# Patient Record
Sex: Female | Born: 1985 | Race: Black or African American | Hispanic: No | Marital: Single | Smoking: Current every day smoker
Health system: Southern US, Community
[De-identification: ages and names within clinical notes are randomized; demographics above are authoritative.]

## PROBLEM LIST (undated history)

## (undated) DIAGNOSIS — Z789 Other specified health status: Secondary | ICD-10-CM

## (undated) DIAGNOSIS — R112 Nausea with vomiting, unspecified: Secondary | ICD-10-CM

## (undated) DIAGNOSIS — Z9889 Other specified postprocedural states: Secondary | ICD-10-CM

## (undated) HISTORY — PX: WISDOM TOOTH EXTRACTION: SHX21

## (undated) HISTORY — PX: CERVICAL CERCLAGE: SHX1329

---
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
## 2007-07-14 ENCOUNTER — Ambulatory Visit (HOSPITAL_COMMUNITY): Admission: RE | Admit: 2007-07-14 | Discharge: 2007-07-15 | Payer: Self-pay | Admitting: Obstetrics

## 2007-08-05 ENCOUNTER — Inpatient Hospital Stay (HOSPITAL_COMMUNITY): Admission: AD | Admit: 2007-08-05 | Discharge: 2007-08-16 | Payer: Self-pay | Admitting: Obstetrics

## 2007-08-13 ENCOUNTER — Encounter (INDEPENDENT_AMBULATORY_CARE_PROVIDER_SITE_OTHER): Payer: Self-pay | Admitting: Obstetrics

## 2010-04-08 ENCOUNTER — Inpatient Hospital Stay (HOSPITAL_COMMUNITY)
Admission: AD | Admit: 2010-04-08 | Discharge: 2010-04-09 | Disposition: A | Discharge status: Home / Self Care | Payer: Self-pay | Source: Ambulatory Visit | Attending: Obstetrics | Admitting: Obstetrics

## 2010-04-08 DIAGNOSIS — A499 Bacterial infection, unspecified: Secondary | ICD-10-CM

## 2010-04-08 DIAGNOSIS — N760 Acute vaginitis: Secondary | ICD-10-CM | POA: Insufficient documentation

## 2010-04-08 DIAGNOSIS — N949 Unspecified condition associated with female genital organs and menstrual cycle: Secondary | ICD-10-CM | POA: Insufficient documentation

## 2010-04-08 DIAGNOSIS — B9689 Other specified bacterial agents as the cause of diseases classified elsewhere: Secondary | ICD-10-CM | POA: Insufficient documentation

## 2010-04-08 LAB — WET PREP, GENITAL
Trich, Wet Prep: NONE SEEN
Yeast Wet Prep HPF POC: NONE SEEN

## 2010-04-08 LAB — POCT PREGNANCY, URINE: Preg Test, Ur: NEGATIVE

## 2010-05-28 NOTE — Op Note (Signed)
NAME:  Ariel Pittman, Ariel Pittman             ACCOUNT NO.:  [id number]   MEDICAL RECORD NO.:  [id number]          PATIENT TYPE:  INP   LOCATION:  9316                          FACILITY:  [facility]   PHYSICIAN:  Kathreen Cosier, M.D.DATE OF BIRTH:  Dec 20, 1985   DATE OF PROCEDURE:  08/13/2007  DATE OF DISCHARGE:  08/16/2007                               OPERATIVE REPORT   PREOPERATIVE DIAGNOSES:  1. Incompetent cervix.  2. Breech presentation.  3. Ruptured membranes.  4. Failed cerclage at 24 weeks and classical.   POSTOPERATIVE DIAGNOSES:  1. Incompetent cervix.  2. Breech presentation.  3. Ruptured membranes.  4. Failed cerclage at 24 weeks and classical.   ANESTHESIA:  Spinal.   PROCEDURE:  The patient was placed on the operating table in the supine  position after the spinal was administered.  Transverse suprapubic  incision made was carried to the rectus fascia.  Fascia cleaned and  incised to length of the incision.  Recti muscles retracted laterally.  Peritoneum incised longitudinally.  There was no lower segment though a  transverse incision was made on the uterus.  The patient was delivered  of a 1 pound 5-ounce female, Apgar 2, 5, and 6 from breech extraction.  The team was in attendance.  The placenta was anterior and removed  manually and sent to pathology.  The pH was 7.38.  Uterine cavity  cleaned with dry laps.  The uterine incision closed in one layer with  continuous suture of #1 chromic.  Hemostasis was satisfactory.  Bladder  flap reattached with 2-0 chromic.  Abdomen closed in layers; peritoneum,  continuous suture with 0 chromic; fascia, continuous suture with Dexon.  Skin closed with subcuticular suture of 4-0 Monocryl.  The patient  tolerated the procedure well and was taken to recovery room in good  condition.           ______________________________  Kathreen Cosier, M.D.     BAM/MEDQ  D:  09/22/2007  T:  09/22/2007  Job:  [id number]

## 2010-05-28 NOTE — Op Note (Signed)
NAME:  Ariel Pittman, Ariel Pittman             ACCOUNT NO.:  [id number]   MEDICAL RECORD NO.:  [id number]          PATIENT TYPE:  AMB   LOCATION:  SDC                           [facility]   PHYSICIAN:  Kathreen Cosier, M.D.DATE OF BIRTH:  05-20-1985   DATE OF PROCEDURE:  07/14/2007  DATE OF DISCHARGE:                               OPERATIVE REPORT   PREOPERATIVE DIAGNOSIS:  Incompetent cervix.   POSTOPERATIVE DIAGNOSIS:  Incompetent cervix.   PROCEDURE:  Placement of cervical cerclage.   PROCEDURE IN DETAIL:  After the spinal had administered and the patient  in lithotomy position, perineum and vagina prepped and draped.  Bladder  emptied with straight catheter.  Examination revealed cervix to be open.  A fingertip and very short weighted speculum placed in the vagina.  The  #5 Mersilene band was obtained and the needle was removed and replaced  by a small cutting edge needles.  The cerclage was placed starting at 12  o'clock in the usual manner and then tied at 12 o'clock.  Cervix was now  noted to be closed.  The patient tolerated the procedure well and taken  to recovery room in good condition.           ______________________________  Kathreen Cosier, M.D.     BAM/MEDQ  D:  07/14/2007  T:  07/15/2007  Job:  [id number]

## 2010-05-31 ENCOUNTER — Emergency Department (HOSPITAL_COMMUNITY)
Admission: EM | Admit: 2010-05-31 | Discharge: 2010-05-31 | Disposition: A | Discharge status: Home / Self Care | Payer: Self-pay | Attending: Emergency Medicine | Admitting: Emergency Medicine

## 2010-05-31 DIAGNOSIS — H9209 Otalgia, unspecified ear: Secondary | ICD-10-CM | POA: Insufficient documentation

## 2010-05-31 DIAGNOSIS — J029 Acute pharyngitis, unspecified: Secondary | ICD-10-CM | POA: Insufficient documentation

## 2010-05-31 LAB — URINALYSIS, ROUTINE W REFLEX MICROSCOPIC
Bilirubin Urine: NEGATIVE
Glucose, UA: NEGATIVE mg/dL
Hgb urine dipstick: NEGATIVE
Specific Gravity, Urine: 1.019 (ref 1.005–1.030)
Urobilinogen, UA: 0.2 mg/dL (ref 0.0–1.0)
pH: 6.5 (ref 5.0–8.0)

## 2010-05-31 LAB — RAPID STREP SCREEN (MED CTR MEBANE ONLY): Streptococcus, Group A Screen (Direct): NEGATIVE

## 2010-05-31 NOTE — H&P (Signed)
   NAME:  Ariel Pittman, Ariel Pittman                       ACCOUNT NO.:  [id number]   MEDICAL RECORD NO.:  [id number]                   PATIENT TYPE:  INP   LOCATION:  9144                                 [facility]   PHYSICIAN:  Kathreen Cosier, M.D.           DATE OF BIRTH:  23-Apr-1985   DATE OF ADMISSION:  09/12/2002  DATE OF DISCHARGE:                                HISTORY & PHYSICAL   HISTORY OF PRESENT ILLNESS:  The patient is a 25 year old primigravida, [facility]  November 07, 2002, at 32 weeks, who was admitted with premature contractions,  given terbutaline subcu with no effect and she was started on magnesium  sulfate.  She was given 4 g loading, 2 g/hr and betamethasone 12.5 IM x2  doses.  On September 12, 2002, fetal fibronectin was done which was positive.  Her cervix was 1-cm, 70%.  She had a comfortable night on the night of  September 12, 2002 and the morning of September 13, 2002, she was having 4-5  contractions an hour, mild.  She was not feeling them.  She was on magnesium  sulfate 2 g/hr.   On the night of September 13, 2002, the patient ruptured her membranes and the  nurse examined the patient and said she was 5-cm, pushing and she was  uncertain of the presentation.  An ultrasound was done which confirmed  double footling breech.  The patient was in labor, so, it was decided she  would deliver by C-section for premature rupture of membranes at 32-weeks in  active labor.   PHYSICAL EXAMINATION:  GENERAL:  This is a well-developed female, in labor.  HEENT:  Negative.  LUNGS:  Clear.  HEART:  Regular rhythm.  No murmurs, no gallops.  ABDOMEN:  Is 30-week size.  Fetal heart rate 140.  PELVIC:  As described above.  EXTREMITIES:  Negative.                                               Kathreen Cosier, M.D.    BAM/MEDQ  D:  09/13/2002  T:  09/14/2002  Job:  [id number]

## 2010-05-31 NOTE — Discharge Summary (Signed)
   NAME:  Ariel Pittman, Ariel Pittman                       ACCOUNT NO.:  [id number]   MEDICAL RECORD NO.:  [id number]                   PATIENT TYPE:  INP   LOCATION:  9146                                 [facility]   PHYSICIAN:  Kathreen Cosier, M.D.           DATE OF BIRTH:  Dec 29, 1985   DATE OF ADMISSION:  09/18/2002  DATE OF DISCHARGE:  09/21/2002                                 DISCHARGE SUMMARY   HISTORY OF PRESENT ILLNESS:  The patient is a 25 year old gravida 1, para 1  who had a low transverse cesarean section on September 13, 2002 because of  breech presentation and ruptured membranes.  She was admitted with a  temperature of 102.3 on September 18, 2002.  She was started on antibiotics  and on September 5 and September 6 she remained afebrile.  On September 7  her incision was noted to be tender and puffy and using Q-tips the incision  was opened in three places, draining a large amount of serosanguinous  material.  The incision was cleaned with hydrogen peroxide and packed with  iodoform gauze.  This was done on September 7.  The patient was discharged  on September 8.  She will be followed by [facility] and have her  incision cleaned daily until healing.   DISCHARGE DIAGNOSES:  Status post readmission for a wound infection post  primary low transverse cesarean section.                                               Kathreen Cosier, M.D.    BAM/MEDQ  D:  09/21/2002  T:  09/21/2002  Job:  [id number]

## 2010-05-31 NOTE — Discharge Summary (Signed)
   NAME:  Ariel Pittman, Ariel Pittman                       ACCOUNT NO.:  [id number]   MEDICAL RECORD NO.:  [id number]                   PATIENT TYPE:  INP   LOCATION:  9144                                 [facility]   PHYSICIAN:  Kathreen Cosier, M.D.           DATE OF BIRTH:  02-Feb-1985   DATE OF ADMISSION:  09/12/2002  DATE OF DISCHARGE:                                 DISCHARGE SUMMARY   The patient is a 25 year old primigravida, [facility] November 07, 2002.  She is 32-  weeks pregnant.  She was admitted with premature contractions, given  terbutaline subcu with no effect and was brought in for magnesium sulfate.  On admission she was 1-cm and long.  She got 4 gram loading of mag, 2 grams  per hour and betamethasone 12.5 IM x 2 doses.  The fetal fibronectin was  positive.  On the night of September 13, 2002 her membranes ruptured  spontaneously and she was 5-cm and she was a breech presentation.  She  underwent a primary low-transverse cesarean section and delivered a 3 pound  1 ounce female.  She had a T incision on her uterus because the head was  wrapped.  There was no well developed lower segment.  The cord pH was 7.39  and the weight of the baby correction was 3 pounds 4 ounces.   Postoperatively, her hemoglobin was 8.3.  She was asymptomatic.  Her GC  culture also came back positive and she received 1 gram of Rocephin IM prior  to discharge.  She was discharged on the third postoperative day,  ambulatory, on a regular diet, on Tylox for pain and ferrous sulfate for  anemia.   DISCHARGE DIAGNOSES:  Status post primary low-transverse cesarean section  with a T incision on the uterus.   Patient advised not to labor in the future.  She had premature ruptured  membranes at 58.  Positive GC culture treated with Rocephin.                                               Kathreen Cosier, M.D.    BAM/MEDQ  D:  09/16/2002  T:  09/17/2002  Job:  [id number]

## 2010-05-31 NOTE — Op Note (Signed)
   NAME:  Ariel Pittman, Ariel Pittman                       ACCOUNT NO.:  [id number]   MEDICAL RECORD NO.:  [id number]                   PATIENT TYPE:  INP   LOCATION:  9144                                 [facility]   PHYSICIAN:  Kathreen Cosier, M.D.           DATE OF BIRTH:  Apr 11, 1985   DATE OF PROCEDURE:  09/13/2002  DATE OF DISCHARGE:                                 OPERATIVE REPORT   PREOPERATIVE DIAGNOSIS:  Premature rupture of membranes at 32 weeks in  active labor with breech presentation.   SURGEON:  Kathreen Cosier, M.D.   ANESTHESIA:  Spinal.   PROCEDURE:  The patient was placed on operating table in supine position.  Abdomen prepped and draped.  Catheterized the bladder.  After the spinal was  administered, bladder emptied to Foley catheter.  Transverse suprapubic  incision made and carried down to the rectus fascia.  The fascia was  cleaned.  Through a cephalad incision, the recti muscles were retracted  laterally.  The peritoneum was entered.  The lower segmental pole was  developed.  Transverse lower uterine incision made in the visceral  peritoneum above the bladder and the bladder was mobilized inferiorly.  Transverse lower uterine incision was made and double footling breech  presentation.  However, difficulty was experienced getting the head  delivered, so a T incision was made in the uterus and delivery thereafter  was without problems.  APGAR score was 2 and 8.  The baby weighed 3 lb, 4  oz, female, pH 7.39.  placenta fundal removed manually and sent to  pathology.  Uterine cavity was dried with laps.  Cord pH was 7.39.  The  classical portion of the incision was closed in two layers with continuous  suture of #1 chromic.  Hemostasis was satisfactory.  The transverse lower  uterine incision was closed in 1 layer with continuous suture of #1 chromic.  Bladder flap was reattached with 2-0 chromic.  Uterus was contracted.  Tubes  and ovaries normal.  Abdomen  closed.  The peritoneum closed continuous with  3-0 chromic.  Fascia continuous with 3-0 Dexon.  Skin closed with  subcuticular stitch of 3-0 plain.   BLOOD LOSS:  400 cubic centimeter.   The patient tolerated the procedure well and was transferred to Recovery  Room in good condition.                                               Kathreen Cosier, M.D.    BAM/MEDQ  D:  09/13/2002  T:  09/14/2002  Job:  [id number]

## 2010-05-31 NOTE — Discharge Summary (Signed)
NAME:  Ariel Pittman, Ariel Pittman             ACCOUNT NO.:  [id number]   MEDICAL RECORD NO.:  [id number]          PATIENT TYPE:  INP   LOCATION:  9316                          [facility]   PHYSICIAN:  Kathreen Cosier, M.D.DATE OF BIRTH:  Nov 04, 1985   DATE OF ADMISSION:  08/05/2007  DATE OF DISCHARGE:  08/16/2007                               DISCHARGE SUMMARY   The patient is a 25 year old gravida 4, para 0-1-2-1, [facility] November 25, 2007, the patient had a history of incompetent cervix and also had a  cerclage placed.  She was seen in the office and her membranes were  bulging.  She was [redacted] weeks pregnant and admitted to the hospital.  She  got betamethasone and her membranes ruptured on admission.  By August 09, 2007, she was 24 weeks and 4 days.  Ultrasound showed a breech  presentation.  On August 11, 2007, she started contracting and the  cerclage was removed.  She was started on magnesium 4 g, loading 2 g now  and she stopped contracting.  On August 13, 2007, she started laboring and  ultrasound confirmed breech.  She was taken to the operating room and  had a C-section.  She had a female, Apgar 2, 5, 6, weighing 1 pound 5  ounces, pH was 7.38.  The patient had a classical cesarean section.  She  did well postop.  On admission, her hemoglobin 9.5, postop 8.5,  platelets 311, 260.  Sodium 138, potassium 3.8, chloride 105, CO2 27.  She was discharged on the third postop day on Tylox for pain, Ambien 10  mg p.o. nightly, on ferrous sulfate for anemia.   DISCHARGE DIAGNOSES:  Incompetent cervix, feels cerclage, and classical  cesarean section for breech at 24 weeks.           ______________________________  Kathreen Cosier, M.D.     BAM/MEDQ  D:  09/22/2007  T:  09/22/2007  Job:  [id number]

## 2010-05-31 NOTE — Discharge Summary (Signed)
NAME:  Ariel Pittman, FORNEY             ACCOUNT NO.:  [id number]   MEDICAL RECORD NO.:  [id number]          PATIENT TYPE:  INP   LOCATION:  9316                          [facility]   PHYSICIAN:  Kathreen Cosier, M.D.DATE OF BIRTH:  Dec 03, 1985   DATE OF ADMISSION:  08/05/2007  DATE OF DISCHARGE:  08/16/2007                               DISCHARGE SUMMARY   The patient is a 25 year old gravida 4, para 0-1-2-1, [facility] November 25, 2007.  She had a cerclage placed because of incompetent cervix.  On  examination on August 05, 2007 in my office showed bulging membranes and  she was admitted to the hospital at 23 weeks.  She was placed on  magnesium sulfate.  She received betamethasone and ultrasound showed a  breech.  She was placed on ampicillin and gentamicin.  On 08/11/2007,  she started to contract and the cerclage was removed and magnesium  sulfate was restarted, 2 g an hour.  Ultrasound showed breech  presentation and after the magnesium was started, she stopped  contracting from August 11, 2007 to August 13, 2007 and on August 13, 2007  started contracting again.  She was taken to the operating room and had  repeat low transverse cesarean section.  She had a 1 pound 5 ounce  female Apgars of 2, 5, and 6.  Postop, she did well.   Her hemoglobin was 8.5 and white count 16.  Sodium 138, potassium 3.8,  chloride 105, RPR negative, HIV negative.  She was discharged on the  third postoperative day on Tylox for pain, Ambien 10 mg at bedtime, and  ferrous sulfate for anemia.   DISCHARGE DIAGNOSES:  Status post premature rupture of membranes,  incompetent cervix, premature labor at 25 weeks with subsequent repeat  low transverse cesarean section.           ______________________________  Kathreen Cosier, M.D.     BAM/MEDQ  D:  09/01/2007  T:  09/01/2007  Job:  [id number]

## 2010-10-10 ENCOUNTER — Encounter (HOSPITAL_COMMUNITY): Payer: Self-pay | Admitting: *Deleted

## 2010-10-10 ENCOUNTER — Inpatient Hospital Stay (HOSPITAL_COMMUNITY)
Admission: AD | Admit: 2010-10-10 | Discharge: 2010-10-10 | Disposition: A | Discharge status: Home / Self Care | Payer: Self-pay | Source: Ambulatory Visit | Attending: Obstetrics | Admitting: Obstetrics

## 2010-10-10 DIAGNOSIS — N760 Acute vaginitis: Secondary | ICD-10-CM | POA: Insufficient documentation

## 2010-10-10 DIAGNOSIS — B9689 Other specified bacterial agents as the cause of diseases classified elsewhere: Secondary | ICD-10-CM | POA: Insufficient documentation

## 2010-10-10 DIAGNOSIS — A499 Bacterial infection, unspecified: Secondary | ICD-10-CM

## 2010-10-10 HISTORY — DX: Other specified health status: Z78.9

## 2010-10-10 LAB — WET PREP, GENITAL
Trich, Wet Prep: NONE SEEN
Yeast Wet Prep HPF POC: NONE SEEN

## 2010-10-10 LAB — URINALYSIS, ROUTINE W REFLEX MICROSCOPIC
Bilirubin Urine: NEGATIVE
Glucose, UA: NEGATIVE mg/dL
Leukocytes, UA: NEGATIVE
Nitrite: NEGATIVE
Specific Gravity, Urine: >1.03 — ABNORMAL HIGH (ref 1.005–1.030)
pH: 6 (ref 5.0–8.0)

## 2010-10-10 LAB — CBC
HCT: 27.3 — ABNORMAL LOW
Hemoglobin: 9.4 — ABNORMAL LOW
MCV: 94.7
Platelets: 253
RBC: 2.88 — ABNORMAL LOW
WBC: 8.3

## 2010-10-10 MED ORDER — METRONIDAZOLE 500 MG PO TABS: 500.0000 mg | ORAL_TABLET | Freq: Two times a day (BID) | ORAL | Status: AC

## 2010-10-10 NOTE — Progress Notes (Signed)
Pt reports she thinks she has a bladder infection, states her urine has an odor.denies pain or burning with urination.

## 2010-10-10 NOTE — Progress Notes (Signed)
Pt reports vaginal discharge with a "fishy" smell.

## 2010-10-10 NOTE — ED Provider Notes (Signed)
History     No chief complaint on file.  HPI C/O odor to urine, fishy smelling vaginal discharge. No pain or vaginal bleeding.   OB History    Grav Para Term Preterm Abortions TAB SAB Ect Mult Living   4 2  2 2 1 1   2       Past Medical History  Diagnosis Date  . No pertinent past medical history     Past Surgical History  Procedure Date  . Cesarean section   . Cervical cerclage     No family history on file.  History  Substance Use Topics  . Smoking status: Current Everyday Smoker -- 0.5 packs/day  . Smokeless tobacco: Not on file  . Alcohol Use: 1.0 oz/week    2 drink(s) per week    Allergies:  Allergies  Allergen Reactions  . Tomato Hives    No prescriptions prior to admission    Review of Systems  Constitutional: Negative.   Respiratory: Negative.   Cardiovascular: Negative.   Gastrointestinal: Negative for nausea, vomiting, abdominal pain, diarrhea and constipation.  Genitourinary: Negative for dysuria, urgency, frequency, hematuria and flank pain.       Negative for vaginal bleeding, cramping/contractions, + discharge   Musculoskeletal: Negative.   Neurological: Negative.   Psychiatric/Behavioral: Negative.    Physical Exam   Blood pressure 120/72, pulse 84, temperature 98.6 F (37 C), resp. rate 18, height 5\' 6"  (1.676 m), weight 68.493 kg (151 lb), last menstrual period 09/14/2010.  Physical Exam  Nursing note and vitals reviewed. Constitutional: She is oriented to person, place, and time. She appears well-developed and well-nourished. No distress.  HENT:  Head: Normocephalic and atraumatic.  Cardiovascular: Normal rate.   Respiratory: Effort normal. No respiratory distress.  GI: Soft. She exhibits no distension and no mass. There is no tenderness. There is no rebound and no guarding.  Genitourinary: There is no rash or lesion on the right labia. There is no rash or lesion on the left labia. Uterus is not deviated, not enlarged, not fixed  and not tender. Cervix exhibits no motion tenderness, no discharge and no friability. Right adnexum displays no mass, no tenderness and no fullness. Left adnexum displays no mass, no tenderness and no fullness. No erythema, tenderness or bleeding around the vagina. Vaginal discharge (white, foamy, malodorous) found.  Musculoskeletal: Normal range of motion.  Neurological: She is alert and oriented to person, place, and time.  Skin: Skin is warm and dry.  Psychiatric: She has a normal mood and affect.    MAU Course  Procedures  Results for orders placed during the hospital encounter of 10/10/10 (from the past 24 hour(s))  URINALYSIS, ROUTINE W REFLEX MICROSCOPIC     Status: Abnormal   Collection Time   10/10/10 10:35 PM      Component Value Range   Color, Urine YELLOW  YELLOW    Appearance CLEAR  CLEAR    Specific Gravity, Urine >1.030 (*) 1.005 - 1.030    pH 6.0  5.0 - 8.0    Glucose, UA NEGATIVE  NEGATIVE (mg/dL)   Hgb urine dipstick NEGATIVE  NEGATIVE    Bilirubin Urine NEGATIVE  NEGATIVE    Ketones, ur NEGATIVE  NEGATIVE (mg/dL)   Protein, ur NEGATIVE  NEGATIVE (mg/dL)   Urobilinogen, UA 0.2  0.0 - 1.0 (mg/dL)   Nitrite NEGATIVE  NEGATIVE    Leukocytes, UA NEGATIVE  NEGATIVE   POCT PREGNANCY, URINE     Status: Normal   Collection Time  10/10/10 10:55 PM      Component Value Range   Preg Test, Ur NEGATIVE    WET PREP, GENITAL     Status: Abnormal   Collection Time   10/10/10 11:30 PM      Component Value Range   Yeast, Wet Prep NONE SEEN  NONE SEEN    Trich, Wet Prep NONE SEEN  NONE SEEN    Clue Cells, Wet Prep MODERATE (*) NONE SEEN    WBC, Wet Prep HPF POC MODERATE (*) NONE SEEN      Assessment and Plan  Bacterial vaginosis - flagyl rx given No douching, recommended probiotic supplement, gentle cleansing  Oiva Dibari 10/11/2010, 1:56 AM

## 2010-10-11 LAB — CBC
HCT: 27.7 — ABNORMAL LOW
HCT: 28 — ABNORMAL LOW
Hemoglobin: 9.5 — ABNORMAL LOW
MCHC: 34
MCV: 95.8
MCV: 97.5
Platelets: 311
RBC: 2.89 — ABNORMAL LOW
RBC: 2.59 — ABNORMAL LOW
RDW: 12.2
WBC: 18.1 — ABNORMAL HIGH
WBC: 16 — ABNORMAL HIGH

## 2010-10-11 LAB — COMPREHENSIVE METABOLIC PANEL
BUN: 7
Calcium: 9.1
Glucose, Bld: 79
Total Protein: 6.8

## 2010-10-11 LAB — CREATININE, SERUM: GFR calc Af Amer: >60

## 2010-10-11 LAB — URINALYSIS, DIPSTICK ONLY
Bilirubin Urine: NEGATIVE
Glucose, UA: NEGATIVE
Hgb urine dipstick: NEGATIVE
Ketones, ur: NEGATIVE
Leukocytes, UA: NEGATIVE
pH: 6.5

## 2010-10-11 LAB — DIFFERENTIAL
Lymphocytes Relative: 12
Lymphs Abs: 1.4
Monocytes Relative: 5
Neutro Abs: 9.2 — ABNORMAL HIGH
Neutrophils Relative %: 80 — ABNORMAL HIGH

## 2010-10-11 NOTE — ED Provider Notes (Signed)
Agree with above note.  Ariel Pittman 10/11/2010 7:55 AM

## 2010-10-12 LAB — GC/CHLAMYDIA PROBE AMP, GENITAL: Chlamydia, DNA Probe: NEGATIVE

## 2010-10-22 LAB — LUPUS ANTICOAGULANT PANEL

## 2010-10-22 LAB — CBC
HCT: 30.2 — ABNORMAL LOW
Hemoglobin: 10.4 — ABNORMAL LOW
Platelets: 284
WBC: 8.2

## 2010-10-22 LAB — CARDIOLIPIN ANTIBODIES, IGM+IGG: Anticardiolipin IgM: 9 — ABNORMAL LOW (ref <10)

## 2010-10-25 LAB — URINALYSIS, ROUTINE W REFLEX MICROSCOPIC
Glucose, UA: NEGATIVE
Hgb urine dipstick: NEGATIVE
Protein, ur: NEGATIVE
Specific Gravity, Urine: 1.01

## 2010-10-25 LAB — POCT PREGNANCY, URINE: Preg Test, Ur: POSITIVE

## 2010-10-25 LAB — URINE MICROSCOPIC-ADD ON

## 2010-10-25 LAB — GC/CHLAMYDIA PROBE AMP, GENITAL: Chlamydia, DNA Probe: POSITIVE — AB

## 2010-10-28 LAB — POCT PREGNANCY, URINE
Operator id: 239701
Preg Test, Ur: POSITIVE

## 2010-10-28 LAB — POCT URINALYSIS DIP (DEVICE)
Glucose, UA: NEGATIVE
Nitrite: NEGATIVE
Operator id: 239701
Urobilinogen, UA: 0.2

## 2010-12-28 ENCOUNTER — Emergency Department (HOSPITAL_COMMUNITY)
Admission: EM | Admit: 2010-12-28 | Discharge: 2010-12-28 | Disposition: A | Discharge status: Home / Self Care | Payer: No Typology Code available for payment source | Attending: Emergency Medicine | Admitting: Emergency Medicine

## 2010-12-28 ENCOUNTER — Encounter (HOSPITAL_COMMUNITY): Payer: Self-pay | Admitting: *Deleted

## 2010-12-28 DIAGNOSIS — M549 Dorsalgia, unspecified: Secondary | ICD-10-CM | POA: Insufficient documentation

## 2010-12-28 DIAGNOSIS — T1490XA Injury, unspecified, initial encounter: Secondary | ICD-10-CM | POA: Insufficient documentation

## 2010-12-28 DIAGNOSIS — Y92410 Unspecified street and highway as the place of occurrence of the external cause: Secondary | ICD-10-CM | POA: Insufficient documentation

## 2010-12-28 MED ORDER — IBUPROFEN 800 MG PO TABS: 800.0000 mg | ORAL_TABLET | Freq: Three times a day (TID) | ORAL | Status: AC

## 2010-12-28 MED ORDER — DIAZEPAM 5 MG PO TABS: 5.0000 mg | ORAL_TABLET | Freq: Two times a day (BID) | ORAL | Status: AC

## 2010-12-28 MED ORDER — IBUPROFEN 800 MG PO TABS
800.0000 mg | ORAL_TABLET | Freq: Once | ORAL | Status: AC
  Administered 2010-12-28: 800 mg via ORAL
  Filled 2010-12-28: qty 1
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
## 2010-12-29 NOTE — ED Provider Notes (Signed)
Medical screening examination/treatment/procedure(s) were performed by non-physician practitioner and as supervising physician I was immediately available for consultation/collaboration. Devoria Albe, MD, Armando Gang   Ward Givens, MD 12/29/10 [phone]

## 2012-09-02 ENCOUNTER — Inpatient Hospital Stay (HOSPITAL_COMMUNITY)
Admission: AD | Admit: 2012-09-02 | Discharge: 2012-09-03 | Disposition: A | Discharge status: Home / Self Care | Payer: Medicaid Other | Source: Ambulatory Visit | Attending: Obstetrics | Admitting: Obstetrics

## 2012-09-02 DIAGNOSIS — Z3202 Encounter for pregnancy test, result negative: Secondary | ICD-10-CM | POA: Insufficient documentation

## 2012-09-03 DIAGNOSIS — Z3202 Encounter for pregnancy test, result negative: Secondary | ICD-10-CM

## 2012-09-03 LAB — POCT PREGNANCY, URINE: Preg Test, Ur: NEGATIVE

## 2012-09-03 LAB — URINALYSIS, ROUTINE W REFLEX MICROSCOPIC
Ketones, ur: NEGATIVE mg/dL
Leukocytes, UA: NEGATIVE
Protein, ur: 30 mg/dL — AB
Urobilinogen, UA: 0.2 mg/dL (ref 0.0–1.0)

## 2012-09-03 LAB — URINE MICROSCOPIC-ADD ON

## 2012-09-03 NOTE — MAU Provider Note (Signed)
History     CSN: [id number]  Arrival date and time: 09/02/12 2345   First Provider Initiated Contact with Patient 09/03/12 0136      Chief Complaint  Patient presents with  . Possible Pregnancy   Possible Pregnancy    Ariel Pittman is a 27 y.o. [phone] who presents today for a pregnancy test. She states that her breasts are sore, and she has been taking clomind. She has not taken a HPT.   Past Medical History  Diagnosis Date  . No pertinent past medical history     Past Surgical History  Procedure Laterality Date  . Cesarean section    . Cervical cerclage      No family history on file.  History  Substance Use Topics  . Smoking status: Current Every Day Smoker -- 0.50 packs/day  . Smokeless tobacco: Not on file  . Alcohol Use: 1.0 oz/week    2 drink(s) per week    Allergies:  Allergies  Allergen Reactions  . Tomato Hives    Prescriptions prior to admission  Medication Sig Dispense Refill  . diphenhydrAMINE (BENADRYL) 25 mg capsule Take 25 mg by mouth every 6 (six) hours as needed. For itching         ROS Physical Exam   Blood pressure 124/84, pulse 81, temperature 97.9 F (36.6 C), temperature source Oral, resp. rate 18, height 5' 5.5" (1.664 m), weight 70.308 kg (155 lb), last menstrual period 08/02/2012, SpO2 100.00%.  Physical Exam  Nursing note and vitals reviewed. Constitutional: She is oriented to person, place, and time. She appears well-developed and well-nourished. No distress.  Cardiovascular: Normal rate.   Respiratory: Effort normal.  GI: Soft. There is no tenderness.  Neurological: She is alert and oriented to person, place, and time.  Skin: Skin is warm and dry.  Psychiatric: She has a normal mood and affect.    MAU Course  Procedures  Results for orders placed during the hospital encounter of 09/02/12 (from the past 24 hour(s))  URINALYSIS, ROUTINE W REFLEX MICROSCOPIC     Status: Abnormal   Collection Time    09/03/12  12:01 AM      Result Value Range   Color, Urine RED (*) YELLOW   APPearance CLEAR  CLEAR   Specific Gravity, Urine >1.030 (*) 1.005 - 1.030   pH 6.0  5.0 - 8.0   Glucose, UA NEGATIVE  NEGATIVE mg/dL   Hgb urine dipstick LARGE (*) NEGATIVE   Bilirubin Urine NEGATIVE  NEGATIVE   Ketones, ur NEGATIVE  NEGATIVE mg/dL   Protein, ur 30 (*) NEGATIVE mg/dL   Urobilinogen, UA 0.2  0.0 - 1.0 mg/dL   Nitrite NEGATIVE  NEGATIVE   Leukocytes, UA NEGATIVE  NEGATIVE  URINE MICROSCOPIC-ADD ON     Status: None   Collection Time    09/03/12 12:01 AM      Result Value Range   Squamous Epithelial / LPF RARE  RARE   WBC, UA 0-2  <3 WBC/hpf   RBC / HPF TOO NUMEROUS TO COUNT  <3 RBC/hpf   Urine-Other URINALYSIS PERFORMED ON SUPERNATANT    POCT PREGNANCY, URINE     Status: None   Collection Time    09/03/12  1:11 AM      Result Value Range   Preg Test, Ur NEGATIVE  NEGATIVE     Assessment and Plan   1. Encounter for pregnancy test with result negative    FU with Dr. Gaynell Face as needed  Tawnya Crook 09/03/2012, 1:41 AM

## 2012-09-03 NOTE — MAU Note (Signed)
Pt reports she has been taking Clomid (started on 07/26), LMP 08/02/12. States she feels like she is pregnant. Today some brownish discharge. Has not done a pregnancy test.

## 2012-09-03 NOTE — MAU Note (Addendum)
PT SAYS HER BACK PAIN STARTED ON Tuesday-  WAS LAYING DOWN AND THEN RADIATED TO BREAST.-  TOOK NO MED- THEN MOSTLY WENT AWAY.  SAYS BREAST HURT- STARTED ON WED -  THEN THEY BECAME SWOLLEN.     NO PREG TEST AT  HOME.    PT TAKING CLOMID- STARTED ON 7-26     - X 5 DAYS    STOP ON 10 TH DAY-   THEN ON 12-14 TH DAY SHE HAD SEX.     ,  DID NOT TAKE MED FOR OTHER 2 PREG.      SAYS CYCLE STARTED TODAY-  LIGHT BLEEDING NOW.

## 2012-09-27 ENCOUNTER — Encounter (HOSPITAL_COMMUNITY): Payer: Self-pay | Admitting: *Deleted

## 2012-09-27 ENCOUNTER — Inpatient Hospital Stay (HOSPITAL_COMMUNITY)
Admission: AD | Admit: 2012-09-27 | Discharge: 2012-09-27 | Disposition: A | Discharge status: Home / Self Care | Payer: Medicaid Other | Source: Ambulatory Visit | Attending: Obstetrics | Admitting: Obstetrics

## 2012-09-27 NOTE — MAU Note (Signed)
PT DECIDED  TO LEAVE AND  GET TREATMENT AT HD IN AM.-- WITH BOYFRIEND.

## 2012-09-27 NOTE — MAU Note (Signed)
PT SAYS SHE HAS  LOWER BACK PAIN THAT STARTED  ON 9-13-  NO MED-  SHE SLEPT.     SAYS HAS LOWER ABD PAIN - STARTED YESTERDAY-  TOOK  HYDROCODONE-  LEFT OVER FROM C/S.-  WITH RELIEF.     LAST SEX-    8-31.   NO BIRTH CONTROL.  SAYS NOT PREG.    SAYS HER BOYFRIEND HAS GIRLFRIEND-  AND    SHE HAD STD CHECK AT DR MARSHALL'S OFFICE ON 9-10-  RESULTS- GON   .  DR MARSHALL TOLD HER TO GO TO HD FOR TREATMENT.

## 2013-01-07 ENCOUNTER — Emergency Department (HOSPITAL_COMMUNITY)
Admission: EM | Admit: 2013-01-07 | Discharge: 2013-01-07 | Disposition: A | Discharge status: Home / Self Care | Payer: Medicaid Other | Attending: Emergency Medicine | Admitting: Emergency Medicine

## 2013-01-07 ENCOUNTER — Encounter (HOSPITAL_COMMUNITY): Payer: Self-pay | Admitting: Emergency Medicine

## 2013-01-07 DIAGNOSIS — H538 Other visual disturbances: Secondary | ICD-10-CM | POA: Insufficient documentation

## 2013-01-07 DIAGNOSIS — F17200 Nicotine dependence, unspecified, uncomplicated: Secondary | ICD-10-CM | POA: Insufficient documentation

## 2013-01-07 DIAGNOSIS — R51 Headache: Secondary | ICD-10-CM | POA: Insufficient documentation

## 2013-01-07 MED ORDER — NAPROXEN 500 MG PO TABS: 500.0000 mg | ORAL_TABLET | Freq: Two times a day (BID) | ORAL | Status: DC

## 2013-01-07 MED ORDER — NAPROXEN 500 MG PO TABS
500.0000 mg | ORAL_TABLET | Freq: Once | ORAL | Status: AC
  Administered 2013-01-07: 500 mg via ORAL
  Filled 2013-01-07: qty 1

## 2013-01-07 NOTE — ED Provider Notes (Signed)
CSN: [id number]     Arrival date & time 01/07/13  1542 History  This chart was scribed for non-physician practitioner, Fayrene Helper, PA-C,working with Junius Argyle, MD, by Karle Plumber, ED Scribe.  This patient was seen in room WTR8/WTR8 and the patient's care was started at 6:29 PM.  Chief Complaint  Patient presents with  . Headache   The history is provided by the patient. No language interpreter was used.   HPI Comments:  Ariel Pittman is a 27 y.o. female who presents to the Emergency Department complaining of severe throbbing headache that starts in her forehead then moves to the left around her head that started approximately 5 hours ago. She reports associated blurry vision from her left eye. She states she has h/o these type of headache that last usually around 4-5 hours and has had four this month. She states she normally takes [facility] Powder, Excedrin Migraine, Tylenol or Motrin with no relief. She denies taking anything PTA today. She states she took Benadryl yesterday with no relief. Pt denies coughing, rhinorrhea, sneezing, fever, nausea, vomiting, or menstruation at the moment. She states she does not wear contact lenses or glasses. She denies any chronic medical problems. Her mother is in the room and states there is a strong family h/o migraines.  Past Medical History  Diagnosis Date  . No pertinent past medical history    Past Surgical History  Procedure Laterality Date  . Cesarean section    . Cervical cerclage     No family history on file. History  Substance Use Topics  . Smoking status: Current Every Day Smoker -- 0.50 packs/day  . Smokeless tobacco: Not on file  . Alcohol Use: 1.0 oz/week    2 drink(s) per week   OB History   Grav Para Term Preterm Abortions TAB SAB Ect Mult Living   4 2  2 2 1 1   2      Review of Systems  Constitutional: Negative for fever.  HENT: Negative for rhinorrhea.   Eyes: Positive for visual disturbance (left eye blurriness).   Respiratory: Negative for cough.   Gastrointestinal: Negative for nausea and vomiting.  Musculoskeletal: Negative for neck stiffness.  Neurological: Positive for headaches.    Allergies  Tomato  Home Medications   Current Outpatient Rx  Name  Route  Sig  Dispense  Refill  . diphenhydrAMINE (BENADRYL) 25 mg capsule   Oral   Take 25 mg by mouth every 6 (six) hours as needed. For itching           Triage Vitals: BP 125/89  Pulse 85  Temp(Src) 98.6 F (37 C) (Oral)  Resp 16  SpO2 100%  LMP 01/03/2013 Physical Exam  Nursing note and vitals reviewed. Constitutional: She is oriented to person, place, and time. She appears well-developed and well-nourished.  Nontoxic in appearance.  HENT:  Head: Normocephalic and atraumatic.  Eyes: Conjunctivae and EOM are normal. Pupils are equal, round, and reactive to light.  Neck: Normal range of motion.  No nuchal rigidity.  Cardiovascular: Normal rate.   Pulmonary/Chest: Effort normal.  Musculoskeletal: Normal range of motion. She exhibits no edema and no tenderness.  Neurological: She is alert and oriented to person, place, and time. She has normal strength. No cranial nerve deficit or sensory deficit. She displays a negative Romberg sign. Coordination and gait normal. GCS eye subscore is 4. GCS verbal subscore is 5. GCS motor subscore is 6.  No gross focal neuro deficit.  Skin: Skin is warm and dry.  Psychiatric: She has a normal mood and affect. Her behavior is normal.    ED Course  Procedures (including critical care time) DIAGNOSTIC STUDIES: Oxygen Saturation is 100% on RA, normal by my interpretation.   COORDINATION OF CARE:  Pt with recurrent headache, no red flags.  Mom concern for aneurysm, however no focal neuro deficits, no concerning finding to suggest advance imaging at this time.  I discuss risk/benefit of CT.  Pt acknowledge risk including increase risk of cancer and will hold off on imaging at this time.   Precaution discussed to return.  Neurologist referral, pain medication given.  Pt otherwise in NAD, comfortable.  Headache likely tension vs clustered. 6:39 PM- Will prescribe pt Naproxen for headaches. Pt verbalizes understanding and agrees to plan.  Medications - No data to display  Labs Review Labs Reviewed - No data to display Imaging Review No results found.  EKG Interpretation   None       MDM   1. Headache above the eye region    BP 125/89  Pulse 85  Temp(Src) 98.6 F (37 C) (Oral)  Resp 16  SpO2 100%  LMP 01/03/2013   I personally performed the services described in this documentation, which was scribed in my presence. The recorded information has been reviewed and is accurate.     Fayrene Helper, PA-C 01/07/13 Windell Moment

## 2013-01-07 NOTE — ED Notes (Addendum)
Pt reports 10/10 left anterior headache that began at 2:30. Pt states she has had similar episodes. Pt states she has had four of these headaches this month, which has increased from pervious of which she reports having this headache once per year. Pt reports similar symptoms to prior headaches, however she states that she has a new symptom of left eye blurred vision. Pt denies nausea or vomiting. Pt denies taking medication prior to arrival.

## 2013-01-08 NOTE — ED Provider Notes (Signed)
Medical screening examination/treatment/procedure(s) were performed by non-physician practitioner and as supervising physician I was immediately available for consultation/collaboration.  EKG Interpretation   None         Junius Argyle, MD 01/08/13 1205

## 2013-10-03 ENCOUNTER — Other Ambulatory Visit (HOSPITAL_COMMUNITY)
Admission: RE | Admit: 2013-10-03 | Discharge: 2013-10-03 | Disposition: A | Discharge status: Home / Self Care | Payer: Medicaid Other | Source: Ambulatory Visit | Attending: Family Medicine | Admitting: Family Medicine

## 2013-10-03 ENCOUNTER — Emergency Department (HOSPITAL_COMMUNITY)
Admission: EM | Admit: 2013-10-03 | Discharge: 2013-10-03 | Disposition: A | Discharge status: Home / Self Care | Payer: Medicaid Other | Source: Home / Self Care | Attending: Family Medicine | Admitting: Family Medicine

## 2013-10-03 ENCOUNTER — Encounter (HOSPITAL_COMMUNITY): Payer: Self-pay | Admitting: Emergency Medicine

## 2013-10-03 DIAGNOSIS — N760 Acute vaginitis: Secondary | ICD-10-CM | POA: Insufficient documentation

## 2013-10-03 DIAGNOSIS — A499 Bacterial infection, unspecified: Secondary | ICD-10-CM

## 2013-10-03 DIAGNOSIS — B9689 Other specified bacterial agents as the cause of diseases classified elsewhere: Secondary | ICD-10-CM

## 2013-10-03 DIAGNOSIS — N898 Other specified noninflammatory disorders of vagina: Secondary | ICD-10-CM

## 2013-10-03 DIAGNOSIS — Z113 Encounter for screening for infections with a predominantly sexual mode of transmission: Secondary | ICD-10-CM | POA: Insufficient documentation

## 2013-10-03 LAB — POCT URINALYSIS DIP (DEVICE)
Bilirubin Urine: NEGATIVE
GLUCOSE, UA: NEGATIVE mg/dL
Ketones, ur: NEGATIVE mg/dL
LEUKOCYTES UA: NEGATIVE
NITRITE: NEGATIVE
Protein, ur: NEGATIVE mg/dL
Specific Gravity, Urine: 1.02 (ref 1.005–1.030)
UROBILINOGEN UA: 0.2 mg/dL (ref 0.0–1.0)
pH: 7 (ref 5.0–8.0)

## 2013-10-03 LAB — POCT PREGNANCY, URINE: Preg Test, Ur: NEGATIVE

## 2013-10-03 MED ORDER — METRONIDAZOLE 0.75 % VA GEL: 1.0000 | Freq: Two times a day (BID) | VAGINAL | Status: AC

## 2013-10-03 NOTE — ED Provider Notes (Signed)
CSN: [id number]     Arrival date & time 10/03/13  1609 History   First MD Initiated Contact with Patient 10/03/13 1729     Chief Complaint  Patient presents with  . Back Pain  . Vaginal Itching   (Consider location/radiation/quality/duration/timing/severity/associated sxs/prior Treatment) HPI Comments: Patient presents with mild lower back discomfort that began this am. She also reports vaginal discharge; "milky white" with odor at times. No pelvic pain. No fever or chills. No dysuria. No concerns re: STD questions BV because of the presentation. No chance pregnant.   Patient is a 28 y.o. female presenting with back pain and vaginal itching. The history is provided by the patient.  Back Pain Vaginal Itching    Past Medical History  Diagnosis Date  . No pertinent past medical history    Past Surgical History  Procedure Laterality Date  . Cesarean section    . Cervical cerclage     History reviewed. No pertinent family history. History  Substance Use Topics  . Smoking status: Current Every Day Smoker -- 0.50 packs/day  . Smokeless tobacco: Not on file  . Alcohol Use: 1.0 oz/week    2 drink(s) per week   OB History   Grav Para Term Preterm Abortions TAB SAB Ect Mult Living   Review of Systems  Musculoskeletal: Positive for back pain.  All other systems reviewed and are negative.   Allergies  Tomato  Home Medications   Prior to Admission medications   Medication Sig Start Date End Date Taking? Authorizing Provider  diphenhydrAMINE (BENADRYL) 25 mg capsule Take 25 mg by mouth every 6 (six) hours as needed. For itching    Yes Historical Provider, MD  metroNIDAZOLE (METROGEL VAGINAL) 0.75 % vaginal gel Place 1 Applicatorful vaginally 2 (two) times daily. 10/03/13 10/07/13  Riki Sheer, PA-C  naproxen (NAPROSYN) 500 MG tablet Take 1 tablet (500 mg total) by mouth 2 (two) times daily. 01/07/13   Fayrene Helper, PA-C   LMP 09/11/2013 Physical Exam   Nursing note and vitals reviewed. Constitutional: She is oriented to person, place, and time. She appears well-developed and well-nourished. No distress.  HENT:  Head: Normocephalic and atraumatic.  Pulmonary/Chest: Effort normal. No respiratory distress. She has no wheezes.  Abdominal: Soft. She exhibits no distension. There is no tenderness. There is no rebound and no guarding.  Genitourinary: Uterus normal. Guaiac negative stool. Vaginal discharge found.  Neurological: She is alert and oriented to person, place, and time.  Skin: Skin is warm and dry.  Psychiatric: Her behavior is normal.    ED Course  Procedures (including critical care time) Labs Review Labs Reviewed  POCT URINALYSIS DIP (DEVICE) - Abnormal; Notable for the following:    Hgb urine dipstick SMALL (*)    All other components within normal limits  POCT PREGNANCY, URINE  CERVICOVAGINAL ANCILLARY ONLY    Imaging Review No results found.   MDM   1. Bacterial vaginosis   2. Vaginal discharge    Cover with Metrogel. Check for STD's. F/U if worsens. Education given.     Riki Sheer, PA-C 10/03/13 1821

## 2013-10-03 NOTE — Discharge Instructions (Signed)
Bacterial Vaginosis Bacterial vaginosis is a vaginal infection that occurs when the normal balance of bacteria in the vagina is disrupted. It results from an overgrowth of certain bacteria. This is the most common vaginal infection in women of childbearing age. Treatment is important to prevent complications, especially in pregnant women, as it can cause a premature delivery. CAUSES  Bacterial vaginosis is caused by an increase in harmful bacteria that are normally present in smaller amounts in the vagina. Several different kinds of bacteria can cause bacterial vaginosis. However, the reason that the condition develops is not fully understood. RISK FACTORS Certain activities or behaviors can put you at an increased risk of developing bacterial vaginosis, including:  Having a new sex partner or multiple sex partners.  Douching.  Using an intrauterine device (IUD) for contraception. Women do not get bacterial vaginosis from toilet seats, bedding, swimming pools, or contact with objects around them. SIGNS AND SYMPTOMS  Some women with bacterial vaginosis have no signs or symptoms. Common symptoms include:  Grey vaginal discharge.  A fishlike odor with discharge, especially after sexual intercourse.  Itching or burning of the vagina and vulva.  Burning or pain with urination. DIAGNOSIS  Your health care provider will take a medical history and examine the vagina for signs of bacterial vaginosis. A sample of vaginal fluid may be taken. Your health care provider will look at this sample under a microscope to check for bacteria and abnormal cells. A vaginal pH test may also be done.  TREATMENT  Bacterial vaginosis may be treated with antibiotic medicines. These may be given in the form of a pill or a vaginal cream. A second round of antibiotics may be prescribed if the condition comes back after treatment.  HOME CARE INSTRUCTIONS   Only take over-the-counter or prescription medicines as  directed by your health care provider.  If antibiotic medicine was prescribed, take it as directed. Make sure you finish it even if you start to feel better.  Do not have sex until treatment is completed.  Tell all sexual partners that you have a vaginal infection. They should see their health care provider and be treated if they have problems, such as a mild rash or itching.  Practice safe sex by using condoms and only having one sex partner. SEEK MEDICAL CARE IF:   Your symptoms are not improving after 3 days of treatment.  You have increased discharge or pain.  You have a fever. MAKE SURE YOU:   Understand these instructions.  Will watch your condition.  Will get help right away if you are not doing well or get worse. FOR MORE INFORMATION  Centers for Disease Control and Prevention, Division of STD Prevention: www.cdc.gov/std American Sexual Health Association (ASHA): www.ashastd.org  Document Released: 12/30/2004 Document Revised: 10/20/2012 Document Reviewed: 08/11/2012 ExitCare Patient Information 2015 [employer]. This information is not intended to replace advice given to you by your health care provider. Make sure you discuss any questions you have with your health care provider.  

## 2013-10-03 NOTE — ED Notes (Signed)
C/o lower back pain.  Denies urinary symptoms.  Also c/o having vaginal irritation with odor.  On set yesterday.  No otc treatments tried.  Denies fever, n/v/d

## 2013-10-04 LAB — CERVICOVAGINAL ANCILLARY ONLY
CHLAMYDIA, DNA PROBE: NEGATIVE
Neisseria Gonorrhea: NEGATIVE

## 2013-10-04 NOTE — ED Provider Notes (Signed)
Medical screening examination/treatment/procedure(s) were performed by resident physician or non-physician practitioner and as supervising physician I was immediately available for consultation/collaboration.   Zaryan Yakubov DOUGLAS MD.   Kino Dunsworth D Mariela Rex, MD 10/04/13 1650 

## 2013-10-05 LAB — CERVICOVAGINAL ANCILLARY ONLY
Wet Prep (BD Affirm): NEGATIVE
Wet Prep (BD Affirm): NEGATIVE
Wet Prep (BD Affirm): POSITIVE — AB

## 2013-10-10 NOTE — ED Notes (Signed)
GC/Chlamydia neg., Affirm: Candida and Trich neg., Gardnerella pos.  Pt. adequately treated with Metrogel. Ariel Pittman 10/10/2013

## 2013-11-14 ENCOUNTER — Encounter (HOSPITAL_COMMUNITY): Payer: Self-pay | Admitting: Emergency Medicine

## 2016-03-30 ENCOUNTER — Inpatient Hospital Stay (HOSPITAL_COMMUNITY)
Admission: AD | Admit: 2016-03-30 | Discharge: 2016-03-30 | Disposition: A | Discharge status: Home / Self Care | Payer: Medicaid Other | Source: Ambulatory Visit | Attending: Family Medicine | Admitting: Family Medicine

## 2016-03-30 ENCOUNTER — Encounter (HOSPITAL_COMMUNITY): Payer: Self-pay

## 2016-03-30 DIAGNOSIS — N760 Acute vaginitis: Secondary | ICD-10-CM

## 2016-03-30 DIAGNOSIS — M545 Low back pain: Secondary | ICD-10-CM | POA: Insufficient documentation

## 2016-03-30 DIAGNOSIS — F17210 Nicotine dependence, cigarettes, uncomplicated: Secondary | ICD-10-CM | POA: Insufficient documentation

## 2016-03-30 DIAGNOSIS — Z79899 Other long term (current) drug therapy: Secondary | ICD-10-CM | POA: Insufficient documentation

## 2016-03-30 DIAGNOSIS — B9689 Other specified bacterial agents as the cause of diseases classified elsewhere: Secondary | ICD-10-CM

## 2016-03-30 LAB — URINALYSIS, ROUTINE W REFLEX MICROSCOPIC
Bilirubin Urine: NEGATIVE
GLUCOSE, UA: NEGATIVE mg/dL
Hgb urine dipstick: NEGATIVE
Ketones, ur: NEGATIVE mg/dL
Leukocytes, UA: NEGATIVE
Nitrite: NEGATIVE
Protein, ur: NEGATIVE mg/dL
Specific Gravity, Urine: 1.027 (ref 1.005–1.030)
pH: 6 (ref 5.0–8.0)

## 2016-03-30 LAB — WET PREP, GENITAL
Sperm: NONE SEEN
Trich, Wet Prep: NONE SEEN
Yeast Wet Prep HPF POC: NONE SEEN

## 2016-03-30 LAB — HEPATITIS B SURFACE ANTIGEN: HEP B S AG: NEGATIVE

## 2016-03-30 LAB — POCT PREGNANCY, URINE: PREG TEST UR: NEGATIVE

## 2016-03-30 MED ORDER — REPHRESH VA GEL: 1.0000 "application " | VAGINAL | 3 refills | Status: DC

## 2016-03-30 MED ORDER — METRONIDAZOLE 500 MG PO TABS: 500.0000 mg | ORAL_TABLET | Freq: Two times a day (BID) | ORAL | 0 refills | Status: DC

## 2016-03-30 MED ORDER — IBUPROFEN 600 MG PO TABS: 600.0000 mg | ORAL_TABLET | Freq: Four times a day (QID) | ORAL | 1 refills | Status: DC | PRN

## 2016-03-30 NOTE — MAU Provider Note (Signed)
Chief Complaint:  Back Pain and Vaginal Discharge   First Provider Initiated Contact with Patient 03/30/16 [phone]       HPI: Ariel Pittman is a 31 y.o. [id number] who presents to maternity admissions reporting vaginal discharge with odor as well as left lower back pain.  Is concerned the pain may be related to discharge, and wants STD testing. .She reports no vaginal bleeding, vaginal itching/burning, urinary symptoms, h/a, dizziness, n/v, or fever/chills.    Back Pain  This is a recurrent problem. The current episode started 1 to 4 weeks ago. The problem occurs intermittently. The problem is unchanged. The pain is present in the lumbar spine. The quality of the pain is described as aching. Radiates to: lower abdomen. The pain is mild. The pain is the same all the time. Associated symptoms include abdominal pain and pelvic pain. Pertinent negatives include no dysuria, fever, headaches, numbness or weakness. She has tried nothing for the symptoms.  Vaginal Discharge  The patient's primary symptoms include a genital odor, pelvic pain and vaginal discharge. The patient's pertinent negatives include no genital itching, genital rash or vaginal bleeding. This is a recurrent problem. The current episode started 1 to 4 weeks ago. The problem occurs constantly. The problem has been unchanged. The pain is mild. The problem affects the left side. She is not pregnant. Associated symptoms include abdominal pain and back pain. Pertinent negatives include no constipation, diarrhea, dysuria, fever, headaches, nausea or vomiting. The vaginal discharge was milky and white. There has been no bleeding. She has not been passing clots. She has not been passing tissue. The symptoms are aggravated by intercourse. She has tried nothing for the symptoms. She is sexually active. It is unknown whether or not her partner has an STD. She uses nothing (Trying to get pregnant) for contraception.   RN Note: Lower left side back pain x  2 weeks, no dysuria, vaginal discharge fishy odor.   Past Medical History: Past Medical History:  Diagnosis Date  . Hives   . No pertinent past medical history     Past obstetric history: OB History  Gravida Para Term Preterm AB Living  4 2   2 2 2   SAB TAB Ectopic Multiple Live Births  1 1     2     # Outcome Date GA Lbr Len/2nd Weight Sex Delivery Anes PTL Lv  4 Preterm     F CS-LTranv   LIV  3 SAB           2 TAB           1 Preterm     F CS-LTranv   LIV      Past Surgical History: Past Surgical History:  Procedure Laterality Date  . CERVICAL CERCLAGE    . CESAREAN SECTION      Family History: No family history on file.  Social History: Social History  Substance Use Topics  . Smoking status: Current Every Day Smoker    Packs/day: 0.50  . Smokeless tobacco: Never Used  . Alcohol use 1.0 oz/week    2 Standard drinks or equivalent per week    Allergies:  Allergies  Allergen Reactions  . Tomato Hives    Meds:  Prescriptions Prior to Admission  Medication Sig Dispense Refill Last Dose  . diphenhydrAMINE (BENADRYL) 25 mg capsule Take 25 mg by mouth every 6 (six) hours as needed. For itching    10/02/2013 at Unknown time  . naproxen (NAPROSYN) 500 MG tablet Take  1 tablet (500 mg total) by mouth 2 (two) times daily. 30 tablet 0 Unknown at Unknown time    I have reviewed patient's Past Medical Hx, Surgical Hx, Family Hx, Social Hx, medications and allergies.  ROS:  Review of Systems  Constitutional: Negative for fever.  Gastrointestinal: Positive for abdominal pain. Negative for constipation, diarrhea, nausea and vomiting.  Genitourinary: Positive for pelvic pain and vaginal discharge. Negative for dysuria.  Musculoskeletal: Positive for back pain.  Neurological: Negative for weakness, numbness and headaches.   Other systems negative     Physical Exam  Patient Vitals for the past 24 hrs:  BP Temp Temp src Pulse Resp Height Weight  03/30/16 0848 - 98.8  F (37.1 C) - - - - -  03/30/16 0846 111/83 - Oral (!) 102 16 - -  03/30/16 0840 - - - - - 5\' 6"  (1.676 m) 172 lb (78 kg)   Constitutional: Well-developed, well-nourished female in no acute distress.  Cardiovascular: normal rate and rhythm, no ectopy audible, S1 & S2 heard, no murmur Respiratory: normal effort, no distress. Lungs CTAB with no wheezes or crackles GI: Abd soft, non-tender.  Nondistended.  No rebound, No guarding.  Bowel Sounds audible  MS: Extremities nontender, no edema, normal ROM Neurologic: Alert and oriented x 4.   Grossly nonfocal. GU: Neg CVAT. Skin:  Warm and Dry Psych:  Affect appropriate.  PELVIC EXAM: Cervix pink, visually closed, without lesion, scant white creamy discharge, vaginal walls and external genitalia normal Bimanual exam: Cervix firm, anterior, neg CMT, uterus nontender, nonenlarged, adnexa without tenderness, enlargement, or mass    Labs:    Results for orders placed or performed during the hospital encounter of 03/30/16 (from the past 24 hour(s))  Urinalysis, Routine w reflex microscopic     Status: Abnormal   Collection Time: 03/30/16  8:40 AM  Result Value Ref Range   Color, Urine YELLOW YELLOW   APPearance HAZY (A) CLEAR   Specific Gravity, Urine 1.027 1.005 - 1.030   pH 6.0 5.0 - 8.0   Glucose, UA NEGATIVE NEGATIVE mg/dL   Hgb urine dipstick NEGATIVE NEGATIVE   Bilirubin Urine NEGATIVE NEGATIVE   Ketones, ur NEGATIVE NEGATIVE mg/dL   Protein, ur NEGATIVE NEGATIVE mg/dL   Nitrite NEGATIVE NEGATIVE   Leukocytes, UA NEGATIVE NEGATIVE  Wet prep, genital     Status: Abnormal   Collection Time: 03/30/16  9:05 AM  Result Value Ref Range   Yeast Wet Prep HPF POC NONE SEEN NONE SEEN   Trich, Wet Prep NONE SEEN NONE SEEN   Clue Cells Wet Prep HPF POC PRESENT (A) NONE SEEN   WBC, Wet Prep HPF POC FEW (A) NONE SEEN   Sperm NONE SEEN   Pregnancy, urine POC     Status: None   Collection Time: 03/30/16  9:10 AM  Result Value Ref Range   Preg  Test, Ur NEGATIVE NEGATIVE     Imaging:  No results found.  MAU Course/MDM: I have ordered labs as follows: GC/Chlamydia, wet prep, HIV, Hbsag, RPR per pt request Imaging ordered: none Results reviewed.  Treatments in MAU included none.   Pt stable at time of discharge.  Assessment: Bacterial vaginosis Low back pain Concern for STD   Plan: Discharge home Recommend Probiotics, greek yogurt Rx sent for Flagyl for BV Rx sent for Ibuprofen for back pain Rx sent for Rephresh gel for prevention of further BV   Encouraged to return here or to other Urgent Care/ED if she develops  worsening of symptoms, increase in pain, fever, or other concerning symptoms.   Wynelle Bourgeois CNM, MSN Certified Nurse-Midwife 03/30/2016 8:51 AM

## 2016-03-30 NOTE — MAU Note (Signed)
Lower left side back pain x 2 weeks, no dysuria, vaginal discharge fishy odor.

## 2016-03-30 NOTE — Discharge Instructions (Signed)
Bacterial Vaginosis Bacterial vaginosis is an infection of the vagina. It happens when too many germs (bacteria) grow in the vagina. This infection puts you at risk for infections from sex (STIs). Treating this infection can lower your risk for some STIs. You should also treat this if you are pregnant. It can cause your baby to be born early. Follow these instructions at home: Medicines   Take over-the-counter and prescription medicines only as told by your doctor.  Take or use your antibiotic medicine as told by your doctor. Do not stop taking or using it even if you start to feel better. General instructions   If you your sexual partner is a woman, tell her that you have this infection. She needs to get treatment if she has symptoms. If you have a female partner, he does not need to be treated.  During treatment:  Avoid sex.  Do not douche.  Avoid alcohol as told.  Avoid breastfeeding as told.  Drink enough fluid to keep your pee (urine) clear or pale yellow.  Keep your vagina and butt (rectum) clean.  Wash the area with warm water every day.  Wipe from front to back after you use the toilet.  Keep all follow-up visits as told by your doctor. This is important. Preventing this condition   Do not douche.  Use only warm water to wash around your vagina.  Use protection when you have sex. This includes:  Latex condoms.  Dental dams.  Limit how many people you have sex with. It is best to only have sex with the same person (be monogamous).  Get tested for STIs. Have your partner get tested.  Wear underwear that is cotton or lined with cotton.  Avoid tight pants and pantyhose. This is most important in summer.  Do not use any products that have nicotine or tobacco in them. These include cigarettes and e-cigarettes. If you need help quitting, ask your doctor.  Do not use illegal drugs.  Limit how much alcohol you drink. Contact a doctor if:  Your symptoms do not  get better, even after you are treated.  You have more discharge or pain when you pee (urinate).  You have a fever.  You have pain in your belly (abdomen).  You have pain with sex.  Your bleed from your vagina between periods. Summary  This infection happens when too many germs (bacteria) grow in the vagina.  Treating this condition can lower your risk for some infections from sex (STIs).  You should also treat this if you are pregnant. It can cause early (premature) birth.  Do not stop taking or using your antibiotic medicine even if you start to feel better. This information is not intended to replace advice given to you by your health care provider. Make sure you discuss any questions you have with your health care provider. Document Released: 10/09/2007 Document Revised: 09/15/2015 Document Reviewed: 09/15/2015 Elsevier Interactive Patient Education  2017 [employer] Gynecology Care Providers [facility]  OB/GYN    [facility]  [phone]     Phone: [phone]          [facility]                      [facility]  @[place]     Phone: [phone]  Phone: [phone]         [facility]  Phone: [phone]  Phone: [phone]           [facility] [place]                hone: [phone]  Phone: [phone]         [facility] [address] Francoise Ceo      Phone: [phone]  Phone: [phone]         [facility]                Phone: [phone]  [facility]   [address] [place])          Phone: [phone] [facility]   Phone: [phone]

## 2016-03-31 LAB — GC/CHLAMYDIA PROBE AMP (~~LOC~~) NOT AT ARMC
CHLAMYDIA, DNA PROBE: NEGATIVE
Neisseria Gonorrhea: NEGATIVE

## 2016-03-31 LAB — RPR: RPR Ser Ql: NONREACTIVE

## 2016-03-31 LAB — HIV ANTIBODY (ROUTINE TESTING W REFLEX): HIV Screen 4th Generation wRfx: NONREACTIVE

## 2016-05-11 ENCOUNTER — Encounter (HOSPITAL_COMMUNITY): Payer: Self-pay

## 2016-05-11 ENCOUNTER — Emergency Department (HOSPITAL_COMMUNITY): Payer: Self-pay

## 2016-05-11 ENCOUNTER — Emergency Department (HOSPITAL_COMMUNITY)
Admission: EM | Admit: 2016-05-11 | Discharge: 2016-05-11 | Disposition: A | Discharge status: Home / Self Care | Payer: Self-pay | Attending: Emergency Medicine | Admitting: Emergency Medicine

## 2016-05-11 DIAGNOSIS — F17200 Nicotine dependence, unspecified, uncomplicated: Secondary | ICD-10-CM | POA: Insufficient documentation

## 2016-05-11 DIAGNOSIS — L509 Urticaria, unspecified: Secondary | ICD-10-CM | POA: Insufficient documentation

## 2016-05-11 DIAGNOSIS — R5383 Other fatigue: Secondary | ICD-10-CM | POA: Insufficient documentation

## 2016-05-11 LAB — URINALYSIS, ROUTINE W REFLEX MICROSCOPIC
Bacteria, UA: NONE SEEN
Bilirubin Urine: NEGATIVE
GLUCOSE, UA: NEGATIVE mg/dL
Ketones, ur: NEGATIVE mg/dL
Leukocytes, UA: NEGATIVE
Nitrite: NEGATIVE
PROTEIN: 30 mg/dL — AB
Specific Gravity, Urine: 1.032 — ABNORMAL HIGH (ref 1.005–1.030)
pH: 5 (ref 5.0–8.0)

## 2016-05-11 LAB — CBC WITH DIFFERENTIAL/PLATELET
Basophils Absolute: 0 10*3/uL (ref 0.0–0.1)
Basophils Relative: 0 %
Eosinophils Absolute: 0.2 10*3/uL (ref 0.0–0.7)
Eosinophils Relative: 3 %
HCT: 37 % (ref 36.0–46.0)
Hemoglobin: 12.2 g/dL (ref 12.0–15.0)
LYMPHS ABS: 2.9 10*3/uL (ref 0.7–4.0)
LYMPHS PCT: 47 %
MCH: 30.5 pg (ref 26.0–34.0)
MCHC: 33 g/dL (ref 30.0–36.0)
MCV: 92.5 fL (ref 78.0–100.0)
Monocytes Absolute: 0.4 10*3/uL (ref 0.1–1.0)
Monocytes Relative: 7 %
NEUTROS PCT: 43 %
Neutro Abs: 2.6 10*3/uL (ref 1.7–7.7)
Platelets: 334 10*3/uL (ref 150–400)
RBC: 4 MIL/uL (ref 3.87–5.11)
RDW: 11.5 % (ref 11.5–15.5)
WBC: 6.1 10*3/uL (ref 4.0–10.5)

## 2016-05-11 LAB — COMPREHENSIVE METABOLIC PANEL
ALT: 12 U/L — ABNORMAL LOW (ref 14–54)
AST: 19 U/L (ref 15–41)
Albumin: 4.1 g/dL (ref 3.5–5.0)
Alkaline Phosphatase: 71 U/L (ref 38–126)
Anion gap: 6 (ref 5–15)
BUN: 13 mg/dL (ref 6–20)
CHLORIDE: 108 mmol/L (ref 101–111)
CO2: 27 mmol/L (ref 22–32)
Calcium: 9.1 mg/dL (ref 8.9–10.3)
Creatinine, Ser: 0.76 mg/dL (ref 0.44–1.00)
Glucose, Bld: 72 mg/dL (ref 65–99)
POTASSIUM: 3.6 mmol/L (ref 3.5–5.1)
Sodium: 141 mmol/L (ref 135–145)
Total Bilirubin: 0.3 mg/dL (ref 0.3–1.2)
Total Protein: 7.9 g/dL (ref 6.5–8.1)

## 2016-05-11 LAB — TSH: TSH: 1.592 u[IU]/mL (ref 0.350–4.500)

## 2016-05-11 LAB — PREGNANCY, URINE: PREG TEST UR: NEGATIVE

## 2016-05-11 MED ORDER — ONDANSETRON 4 MG PO TBDP: 4.0000 mg | ORAL_TABLET | Freq: Three times a day (TID) | ORAL | 0 refills | Status: DC | PRN

## 2016-05-11 NOTE — ED Triage Notes (Signed)
For a long time rash with hives all over body getting worse with shortness of breath weight loss 16lb over 3 weeks not eating with excessive tiredness.

## 2016-05-11 NOTE — ED Provider Notes (Signed)
[facility]-EMERGENCY DEPT Provider Note   CSN: [id number] Arrival date & time: 05/11/16  2001     History   Chief Complaint Chief Complaint  Patient presents with  . Rash  . Weight Loss  . Shortness of Breath    HPI Ariel Pittman is a 31 y.o. female.  The history is provided by the patient. No language interpreter was used.  Rash    Shortness of Breath  Associated symptoms include rash.   Ariel Pittman is a 31 y.o. female who presents to the Emergency Department complaining of sob, rash.  She has a history of chronic urticaria and over the last year her urticaria have worsened. She reports intermittent diffuse body itching. She takes Benadryl as needed for itching. She also endorses the last 3 weeks progressive shortness of breath with malaise and fatigue. She has lost 16 pounds over the last 3 weeks. She is has poor appetite and decreased oral intake. No fevers, vomiting, diarrhea, leg swelling. She does have some chest tightness at times as well as lower abdominal pain for the last week. She feels fullness in her neck when she swallows. Her mother has a history of sarcoidosis and renal disease. Past Medical History:  Diagnosis Date  . Hives   . No pertinent past medical history     Patient Active Problem List   Diagnosis Date Noted  . Bacterial vaginosis 10/10/2010    Past Surgical History:  Procedure Laterality Date  . CERVICAL CERCLAGE    . CESAREAN SECTION      OB History    Gravida Para Term Preterm AB Living   SAB TAB Ectopic Multiple Live Births   Home Medications    Prior to Admission medications   Medication Sig Start Date End Date Taking? Authorizing Provider  diphenhydrAMINE (BENADRYL) 25 mg capsule Take 25 mg by mouth every 6 (six) hours as needed. For itching     Historical Provider, MD  ibuprofen (ADVIL,MOTRIN) 600 MG tablet Take 1 tablet (600 mg total) by mouth every 6 (six) hours as needed. 03/30/16   Aviva Signs, CNM  metroNIDAZOLE (FLAGYL) 500 MG tablet Take 1 tablet (500 mg total) by mouth 2 (two) times daily. 03/30/16   Aviva Signs, CNM  naproxen (NAPROSYN) 500 MG tablet Take 1 tablet (500 mg total) by mouth 2 (two) times daily. 01/07/13   Fayrene Helper, PA-C  ondansetron (ZOFRAN ODT) 4 MG disintegrating tablet Take 1 tablet (4 mg total) by mouth every 8 (eight) hours as needed for nausea or vomiting. 05/11/16   Tilden Fossa, MD  Vaginal Lubricant [facility]) GEL Place 1 application vaginally 2 (two) times a week. 03/31/16   Aviva Signs, CNM    Family History History reviewed. No pertinent family history.  Social History Social History  Substance Use Topics  . Smoking status: Current Every Day Smoker    Packs/day: 0.50  . Smokeless tobacco: Never Used  . Alcohol use 1.0 oz/week    2 Standard drinks or equivalent per week     Allergies   Dust mite mixed allergen ext [mite (d. farinae)] and Tomato   Review of Systems Review of Systems  Respiratory: Positive for shortness of breath.   Skin: Positive for rash.  All other systems reviewed and are negative.    Physical Exam Updated Vital Signs BP 114/81 (BP Location: Right  Arm)   Pulse 79   Temp 98 F (36.7 C) (Oral)   Resp 14   Wt 167 lb (75.8 kg)   LMP 05/09/2016   SpO2 99%   BMI 26.95 kg/m   Physical Exam  Constitutional: She is oriented to person, place, and time. She appears well-developed and well-nourished.  HENT:  Head: Normocephalic and atraumatic.  Neck:  Full thyroid with no discrete neck masses  Cardiovascular: Normal rate and regular rhythm.   No murmur heard. Pulmonary/Chest: Effort normal and breath sounds normal. No respiratory distress.  Abdominal: Soft. There is no tenderness. There is no rebound and no guarding.  Musculoskeletal: She exhibits no edema or tenderness.  Neurological: She is alert and oriented to person, place, and time.  Skin: Skin is warm and dry.  Few urticaria of face,  extremities  Psychiatric: She has a normal mood and affect. Her behavior is normal.  Nursing note and vitals reviewed.    ED Treatments / Results  Labs (all labs ordered are listed, but only abnormal results are displayed) Labs Reviewed  COMPREHENSIVE METABOLIC PANEL - Abnormal; Notable for the following:       Result Value   ALT 12 (*)    All other components within normal limits  URINALYSIS, ROUTINE W REFLEX MICROSCOPIC - Abnormal; Notable for the following:    APPearance HAZY (*)    Specific Gravity, Urine 1.032 (*)    Hgb urine dipstick LARGE (*)    Protein, ur 30 (*)    Squamous Epithelial / LPF 6-30 (*)    All other components within normal limits  CBC WITH DIFFERENTIAL/PLATELET  TSH  PREGNANCY, URINE  POC URINE PREG, ED    EKG  EKG Interpretation  Date/Time:  Sunday May 11 2016 21:51:08 EDT Ventricular Rate:  84 PR Interval:    QRS Duration: 86 QT Interval:  362 QTC Calculation: 428 R Axis:   73 Text Interpretation:  Sinus rhythm Confirmed by Lincoln Brigham [phone]) on 05/11/2016 10:38:11 PM       Radiology Dg Chest 2 View  Result Date: 05/11/2016 CLINICAL DATA:  Skin rash with highest. Shortness of breath. Weight loss. EXAM: CHEST  2 VIEW COMPARISON:  None. FINDINGS: Heart size is normal. Mediastinal shadows are normal. The lungs are clear. No bronchial thickening. No infiltrate, mass, effusion or collapse. Pulmonary vascularity is normal. No bony abnormality. IMPRESSION: Normal Electronically Signed   By: Paulina Fusi M.D.   On: 05/11/2016 21:40    Procedures Procedures (including critical care time)  Medications Ordered in ED Medications - No data to display   Initial Impression / Assessment and Plan / ED Course  I have reviewed the triage vital signs and the nursing notes.  Pertinent labs & imaging results that were available during my care of the patient were reviewed by me and considered in my medical decision making (see chart for details).      Patient here for evaluation of shortness of breath, fatigue, weight loss. She is nontoxic appearing on examination. Presentation is not consistent with PE, she is perk negative. No evidence of acute infectious process. CBC without anemia, electrolytes are within normal limits. Consultation on the unclear etiology of her symptoms. Discussed importance of outpatient follow-up for further workup, home care and return precautions.  Final Clinical Impressions(s) / ED Diagnoses   Final diagnoses:  Urticaria  Fatigue, unspecified type    New Prescriptions New Prescriptions   ONDANSETRON (ZOFRAN ODT) 4 MG DISINTEGRATING TABLET    Take  1 tablet (4 mg total) by mouth every 8 (eight) hours as needed for nausea or vomiting.     Tilden Fossa, MD 05/11/16 2325

## 2016-05-11 NOTE — ED Notes (Signed)
Pt stated she went to a dermatologist and she was diagnosed with chronic hives.

## 2016-05-11 NOTE — ED Notes (Signed)
Patient transported to X-ray 

## 2016-09-28 ENCOUNTER — Inpatient Hospital Stay (HOSPITAL_COMMUNITY)
Admission: AD | Admit: 2016-09-28 | Discharge: 2016-09-28 | Disposition: A | Discharge status: Home / Self Care | Payer: Self-pay | Source: Ambulatory Visit | Attending: Obstetrics & Gynecology | Admitting: Obstetrics & Gynecology

## 2016-09-28 DIAGNOSIS — Z5321 Procedure and treatment not carried out due to patient leaving prior to being seen by health care provider: Secondary | ICD-10-CM | POA: Insufficient documentation

## 2016-09-28 LAB — URINALYSIS, ROUTINE W REFLEX MICROSCOPIC
BILIRUBIN URINE: NEGATIVE
Glucose, UA: NEGATIVE mg/dL
HGB URINE DIPSTICK: NEGATIVE
Ketones, ur: NEGATIVE mg/dL
NITRITE: NEGATIVE
Protein, ur: NEGATIVE mg/dL
SPECIFIC GRAVITY, URINE: 1.031 — AB (ref 1.005–1.030)
pH: 5 (ref 5.0–8.0)

## 2016-09-28 LAB — POCT PREGNANCY, URINE: Preg Test, Ur: NEGATIVE

## 2016-09-28 NOTE — MAU Note (Signed)
Started yesterday, having some back pain and some discharge.

## 2016-09-28 NOTE — MAU Note (Signed)
Patient signed AMA  Form. States unable to wait any longer. Will come back tomorrow.

## 2016-09-29 ENCOUNTER — Inpatient Hospital Stay (HOSPITAL_COMMUNITY)
Admission: AD | Admit: 2016-09-29 | Discharge: 2016-09-29 | Disposition: A | Discharge status: Home / Self Care | Payer: Self-pay | Source: Ambulatory Visit | Attending: Family Medicine | Admitting: Family Medicine

## 2016-09-29 ENCOUNTER — Encounter (HOSPITAL_COMMUNITY): Payer: Self-pay

## 2016-09-29 DIAGNOSIS — N760 Acute vaginitis: Secondary | ICD-10-CM | POA: Insufficient documentation

## 2016-09-29 DIAGNOSIS — F17210 Nicotine dependence, cigarettes, uncomplicated: Secondary | ICD-10-CM | POA: Insufficient documentation

## 2016-09-29 DIAGNOSIS — M549 Dorsalgia, unspecified: Secondary | ICD-10-CM | POA: Insufficient documentation

## 2016-09-29 DIAGNOSIS — B9689 Other specified bacterial agents as the cause of diseases classified elsewhere: Secondary | ICD-10-CM | POA: Insufficient documentation

## 2016-09-29 HISTORY — DX: Other specified postprocedural states: Z98.890

## 2016-09-29 HISTORY — DX: Nausea with vomiting, unspecified: R11.2

## 2016-09-29 LAB — WET PREP, GENITAL
Sperm: NONE SEEN
Trich, Wet Prep: NONE SEEN
YEAST WET PREP: NONE SEEN

## 2016-09-29 MED ORDER — PRENATAL PLUS 27-1 MG PO TABS: 1.0000 | ORAL_TABLET | Freq: Every day | ORAL | 0 refills | Status: DC

## 2016-09-29 MED ORDER — METRONIDAZOLE 500 MG PO TABS: 500.0000 mg | ORAL_TABLET | Freq: Two times a day (BID) | ORAL | 0 refills | Status: DC

## 2016-09-29 NOTE — MAU Provider Note (Signed)
History     CSN: [id number]  Arrival date and time: 09/29/16 1008   First Provider Initiated Contact with Patient 09/29/16 1032      Chief Complaint  Patient presents with  . Vaginal Discharge  . Back Pain   Ariel Pittman is a 31 y.o. [phone] presenting with 2 day history of irritative and malodorous vaginal discharge. She denies vulvar or vaginal pruritus. Discharge is clear. No new sexual partner.  Monogamous relationship for 9 years. She also reports mild, diffuse low back dull pain radiating to groin with onset concurrent with vaginal discharge. She is attempting pregnancy.     OB History  Gravida Para Term Preterm AB Living  SAB TAB Ectopic Multiple Live Births  # Outcome Date GA Lbr Len/2nd Weight Sex Delivery Anes PTL Lv  4 Preterm     F CS-LTranv   LIV  3 SAB           2 TAB           1 Preterm     F CS-LTranv   LIV       Past Medical History:  Diagnosis Date  . Hives   . No pertinent past medical history     Past Surgical History:  Procedure Laterality Date  . CERVICAL CERCLAGE    . CESAREAN SECTION      No family history on file.  Social History  Substance Use Topics  . Smoking status: Current Every Day Smoker    Packs/day: 0.50  . Smokeless tobacco: Never Used  . Alcohol use 1.0 oz/week    2 Standard drinks or equivalent per week    Allergies:  Allergies  Allergen Reactions  . Dust Mite Mixed Allergen Ext [Mite (D. Farinae)]   . Tomato Hives    Prescriptions Prior to Admission  Medication Sig Dispense Refill Last Dose  . diphenhydrAMINE (BENADRYL) 25 mg capsule Take 25 mg by mouth every 6 (six) hours as needed. For itching    10/02/2013 at Unknown time  . ibuprofen (ADVIL,MOTRIN) 600 MG tablet Take 1 tablet (600 mg total) by mouth every 6 (six) hours as needed. 30 tablet 1   . metroNIDAZOLE (FLAGYL) 500 MG tablet Take 1 tablet (500 mg total) by mouth 2 (two) times daily. 14 tablet 0   . naproxen (NAPROSYN)  500 MG tablet Take 1 tablet (500 mg total) by mouth 2 (two) times daily. 30 tablet 0 Unknown at Unknown time  . ondansetron (ZOFRAN ODT) 4 MG disintegrating tablet Take 1 tablet (4 mg total) by mouth every 8 (eight) hours as needed for nausea or vomiting. 12 tablet 0   . Vaginal Lubricant (REPHRESH) GEL Place 1 application vaginally 2 (two) times a week. 2 g 3     Review of Systems  Constitutional: Negative for fever.  Gastrointestinal: Positive for abdominal pain. Negative for constipation, diarrhea, nausea and vomiting.  Genitourinary: Negative for dyspareunia, dysuria, flank pain, frequency, hematuria and urgency.  Musculoskeletal: Positive for back pain.   Physical Exam   Blood pressure 119/79, pulse 92, temperature 98.4 F (36.9 C), temperature source Oral, resp. rate 16, height  (1.651 m), weight 170 lb (77.1 kg), last menstrual period 09/14/2016, SpO2 100 %.  Physical Exam  Nursing note and vitals reviewed. Constitutional: She is oriented to person, place, and time. She appears well-developed and well-nourished. No distress.  HENT:  Head:  Normocephalic.  Eyes: No scleral icterus.  Neck: Neck supple.  Cardiovascular: Normal rate.   Respiratory: Effort normal.  GI: Soft. There is no tenderness. There is no guarding.  Genitourinary:  Genitourinary Comments: NEFG Speculum: Moderate amount homogenous white discharge in vault, cervix clean Bimanual: No CMT, uterus NSSP, no adnexal tenderness or masses  Musculoskeletal: Normal range of motion.  Neg CVAT, Back NT  Neurological: She is alert and oriented to person, place, and time.  Skin: Skin is warm and dry.  Psychiatric: She has a normal mood and affect. Her behavior is normal.    MAU Course  Procedures  Results for orders placed or performed during the hospital encounter of 09/29/16 (from the past 24 hour(s))  Wet prep, genital     Status: Abnormal   Collection Time: 09/29/16 10:45 AM  Result Value Ref Range   Yeast  Wet Prep HPF POC NONE SEEN NONE SEEN   Trich, Wet Prep NONE SEEN NONE SEEN   Clue Cells Wet Prep HPF POC PRESENT (A) NONE SEEN   WBC, Wet Prep HPF POC MODERATE (A) NONE SEEN   Sperm NONE SEEN    GC/CT, HIV sent  Assessment and Plan   1. BV (bacterial vaginosis)    Allergies as of 09/29/2016      Reactions   Tomato Hives      Medication List    STOP taking these medications   diphenhydrAMINE 25 mg capsule Commonly known as:  BENADRYL   ibuprofen 600 MG tablet Commonly known as:  ADVIL,MOTRIN   naproxen 500 MG tablet Commonly known as:  NAPROSYN   ondansetron 4 MG disintegrating tablet Commonly known as:  ZOFRAN ODT   REPHRESH Gel     TAKE these medications   metroNIDAZOLE 500 MG tablet Commonly known as:  FLAGYL Take 1 tablet (500 mg total) by mouth 2 (two) times daily.   prenatal vitamin w/FE, FA 27-1 MG Tabs tablet Take 1 tablet by mouth daily.            Discharge Care Instructions        Start     Ordered   09/29/16 0000  prenatal vitamin w/FE, FA (PRENATAL 1 + 1) 27-1 MG TABS tablet  Daily    Question:  Supervising Provider  Answer:  Reva Bores   09/29/16 1101   09/29/16 0000  metroNIDAZOLE (FLAGYL) 500 MG tablet  2 times daily    Question:  Supervising Provider  Answer:  Reva Bores   09/29/16 1128     Follow-up Information    Department, [facility]. Schedule an appointment as soon as possible for a visit in 1 week(s).   Why:  Call for appointment if symptoms not resolved Contact information: [address] [place] [address] [phone]           Chivonne Rascon CNM 09/29/2016, 10:33 AM

## 2016-09-29 NOTE — MAU Note (Signed)
Pt states she has a discharge and lower back pain. States symptoms present for the last 2-3 days.

## 2016-09-29 NOTE — Discharge Instructions (Signed)

## 2016-09-30 LAB — GC/CHLAMYDIA PROBE AMP (~~LOC~~) NOT AT ARMC
Chlamydia: NEGATIVE
NEISSERIA GONORRHEA: NEGATIVE

## 2016-09-30 LAB — HIV ANTIBODY (ROUTINE TESTING W REFLEX): HIV SCREEN 4TH GENERATION: NONREACTIVE

## 2017-08-09 DIAGNOSIS — W228XXA Striking against or struck by other objects, initial encounter: Secondary | ICD-10-CM | POA: Insufficient documentation

## 2017-08-09 DIAGNOSIS — Y9389 Activity, other specified: Secondary | ICD-10-CM | POA: Insufficient documentation

## 2017-08-09 DIAGNOSIS — Y929 Unspecified place or not applicable: Secondary | ICD-10-CM | POA: Insufficient documentation

## 2017-08-09 DIAGNOSIS — S99921A Unspecified injury of right foot, initial encounter: Secondary | ICD-10-CM | POA: Insufficient documentation

## 2017-08-09 DIAGNOSIS — Y998 Other external cause status: Secondary | ICD-10-CM | POA: Insufficient documentation

## 2017-08-10 ENCOUNTER — Emergency Department (HOSPITAL_COMMUNITY): Payer: Self-pay

## 2017-08-10 ENCOUNTER — Encounter (HOSPITAL_COMMUNITY): Payer: Self-pay | Admitting: Emergency Medicine

## 2017-08-10 ENCOUNTER — Other Ambulatory Visit: Payer: Self-pay

## 2017-08-10 ENCOUNTER — Emergency Department (HOSPITAL_COMMUNITY)
Admission: EM | Admit: 2017-08-10 | Discharge: 2017-08-10 | Disposition: A | Discharge status: Home / Self Care | Payer: Self-pay | Attending: Emergency Medicine | Admitting: Emergency Medicine

## 2017-08-10 DIAGNOSIS — S99921A Unspecified injury of right foot, initial encounter: Secondary | ICD-10-CM

## 2017-08-10 NOTE — Discharge Instructions (Signed)
Can buddy tape toes for the next few days.  Can ice and elevate at home.  Tylenol or motrin for pain. Follow-up with your primary care doctor. Return here for any new/acute changes.

## 2017-08-10 NOTE — ED Triage Notes (Signed)
Pt reports hitting second toe on right foot appx 3 hour ago and now having throbbing.

## 2017-08-10 NOTE — ED Provider Notes (Signed)
[facility]   CSN: [id number] Arrival date & time: 08/09/17  2356     History   Chief Complaint Chief Complaint  Patient presents with  . Toe Injury    HPI Ariel Pittman is a 32 y.o. female.  The history is provided by the patient and medical records.     32 y.o. F with hx of hives, presenting to the ED for toe injury.  Patient states she was at her sons birthday party, going down a slide and her right 2nd toe bent backwards the wrong way.  States since then she has continued, throbbing pain in the toe.  Remains ambulatory, mostly walking on the heel of her right foot.  Past Medical History:  Diagnosis Date  . Hives   . No pertinent past medical history   . PONV (postoperative nausea and vomiting)   . Preterm labor     Patient Active Problem List   Diagnosis Date Noted  . Bacterial vaginosis 10/10/2010    Past Surgical History:  Procedure Laterality Date  . CERVICAL CERCLAGE    . CESAREAN SECTION    . WISDOM TOOTH EXTRACTION Bilateral      OB History    Gravida  4   Para  2   Term      Preterm  2   AB  2   Living  2     SAB  1   TAB  1   Ectopic      Multiple      Live Births  2            Home Medications    Prior to Admission medications   Medication Sig Start Date End Date Taking? Authorizing Provider  metroNIDAZOLE (FLAGYL) 500 MG tablet Take 1 tablet (500 mg total) by mouth 2 (two) times daily. 09/29/16   Poe, Deirdre C, CNM  prenatal vitamin w/FE, FA (PRENATAL 1 + 1) 27-1 MG TABS tablet Take 1 tablet by mouth daily. 09/29/16   Poe, Deirdre Salena Saner, CNM    Family History Family History  Problem Relation Age of Onset  . Asthma Mother     Social History Social History   Tobacco Use  . Smoking status: Current Every Day Smoker    Packs/day: 0.50  . Smokeless tobacco: Never Used  Substance Use Topics  . Alcohol use: No    Alcohol/week: 1.2 oz    Types: 2 Standard drinks or  equivalent per week  . Drug use: No     Allergies   Tomato   Review of Systems Review of Systems  Musculoskeletal: Positive for arthralgias.  All other systems reviewed and are negative.    Physical Exam Updated Vital Signs BP 106/64 (BP Location: Left Arm)   Pulse 83   Temp 98.4 F (36.9 C) (Oral)   Resp 18   Ht 5\' 6"  (1.676 m)   Wt 77.1 kg (170 lb)   LMP 08/03/2017   SpO2 100%   BMI 27.44 kg/m   Physical Exam  Constitutional: She is oriented to person, place, and time. She appears well-developed and well-nourished.  HENT:  Head: Normocephalic and atraumatic.  Mouth/Throat: Oropharynx is clear and moist.  Eyes: Pupils are equal, round, and reactive to light. Conjunctivae and EOM are normal.  Neck: Normal range of motion.  Cardiovascular: Normal rate, regular rhythm and normal heart sounds.  Pulmonary/Chest: Effort normal and breath sounds normal.  Abdominal: Soft. Bowel sounds are normal.  Musculoskeletal: Normal range of motion.  Right 2nd toe with small area of bruising on underside of the toe; no gross deformities; pain with ROM; normal distal sensation and perfusion  Neurological: She is alert and oriented to person, place, and time.  Skin: Skin is warm and dry.  Psychiatric: She has a normal mood and affect.  Nursing note and vitals reviewed.    ED Treatments / Results  Labs (all labs ordered are listed, but only abnormal results are displayed) Labs Reviewed - No data to display  EKG None  Radiology Dg Foot Complete Right  Result Date: 08/10/2017 CLINICAL DATA:  2nd toe injury, pain EXAM: RIGHT FOOT COMPLETE - 3+ VIEW COMPARISON:  None. FINDINGS: No fracture or dislocation is seen. The joint spaces are preserved. Visualized soft tissues are within normal limits. IMPRESSION: Negative. Electronically Signed   By: Charline BillsSriyesh  Krishnan M.D.   On: 08/10/2017 01:05    Procedures Procedures (including critical care time)  Medications Ordered in  ED Medications - No data to display   Initial Impression / Assessment and Plan / ED Course  I have reviewed the triage vital signs and the nursing notes.  Pertinent labs & imaging results that were available during my care of the patient were reviewed by me and considered in my medical decision making (see chart for details).  32 y.o. F here with right 2nd toe injury while going down a slide at her son's birthday party, states affected toe bent backwards.  No gross deformity on exam, small area of bruising to the underside of the toe.  Normal distal sensation and perfusion.  X-ray negative.  Toes were buddy tape.  Discussed supportive care measures.  Can follow-up closely with primary care doctor.  Return here for any new or acute changes.  Final Clinical Impressions(s) / ED Diagnoses   Final diagnoses:  Injury of toe on right foot, initial encounter    ED Discharge Orders    None       Garlon HatchetSanders, Haeleigh Streiff M, PA-C 08/10/17 0326    Molpus, Jonny RuizJohn, MD 08/10/17 [phone]

## 2017-11-05 ENCOUNTER — Ambulatory Visit (INDEPENDENT_AMBULATORY_CARE_PROVIDER_SITE_OTHER): Payer: Self-pay | Admitting: Family Medicine

## 2017-11-05 ENCOUNTER — Other Ambulatory Visit (HOSPITAL_COMMUNITY)
Admission: RE | Admit: 2017-11-05 | Discharge: 2017-11-05 | Disposition: A | Discharge status: Home / Self Care | Payer: Medicaid Other | Source: Ambulatory Visit | Attending: Family Medicine | Admitting: Family Medicine

## 2017-11-05 ENCOUNTER — Ambulatory Visit: Payer: Self-pay | Attending: Family Medicine

## 2017-11-05 VITALS — BP 120/83 | HR 83 | Temp 98.1°F | Resp 17 | Ht 66.0 in | Wt 171.0 lb

## 2017-11-05 DIAGNOSIS — N898 Other specified noninflammatory disorders of vagina: Secondary | ICD-10-CM | POA: Diagnosis not present

## 2017-11-05 DIAGNOSIS — Z131 Encounter for screening for diabetes mellitus: Secondary | ICD-10-CM

## 2017-11-05 DIAGNOSIS — Z13220 Encounter for screening for lipoid disorders: Secondary | ICD-10-CM

## 2017-11-05 DIAGNOSIS — L659 Nonscarring hair loss, unspecified: Secondary | ICD-10-CM

## 2017-11-05 NOTE — Progress Notes (Signed)
Ariel Pittman, is a 32 y.o. female  ZOX:[id number]  [id number]  DOB - 1985/12/29  CC: No chief complaint on file.      HPI: Ariel Pittman is a 32 y.o. female is here today to establish care.   Ariel Pittman does not have any active problems on file.    Today's visit:  Ariel Pittman concern for hair loss. Reports back in February, she obtained patches to stop smoking. Used only for 1 week as medication made her ill. Shortly after discontinuing nicotine patches, she noticed her hair began to thin. She is a routine wearer to hair weave and doesn't attribute this as the cause of thinning of hair. No recent changes in hair care products. No family history of female pattern baldness. Concern that problem is health related. No prior history of thyroid dysfunction. Admits to prior stress related to mother health. Mother was ill several months ago and underwent renal transplant successfully.    Vaginitis Patient complains of an abnormal vaginal discharge for 3 months. Vaginal symptoms include discharge described as white and creamy.Vulvar symptoms include none.STI Risk: Very low risk of STD exposure, Possible STD exposureDischarge described as: copious and white.Other associated symptoms: vulvar itching.Menstrual pattern: She had been bleeding regularly. Contraception: none History of bacterial vaginosis.   Patient denies new headaches, chest pain, abdominal pain, nausea, new weakness , numbness or tingling, SOB, edema, or worrisome cough.   Current medications: Current Outpatient Medications:  .  Chlorpheniramine Maleate (ALLERGY RELIEF PO), Take 1 tablet by mouth every 4 (four) hours as needed., Disp: , Rfl:  .  fluconazole (DIFLUCAN) 150 MG tablet, Take 1 tablet (150 mg total) by mouth once for 1 dose., Disp: 1 tablet, Rfl: 0 .  minoxidil (MINOXIDIL FOR WOMEN) 2 % external solution, Apply topically 2 (two) times daily., Disp: 60 mL, Rfl: 0   Pertinent family medical history: family  history includes Asthma in her mother.   Allergies  Allergen Reactions  . Tomato Hives    Social History   Socioeconomic History  . Marital status: Single    Spouse name: Not on file  . Number of children: Not on file  . Years of education: Not on file  . Highest education level: Not on file  Occupational History  . Not on file  Social Needs  . Financial resource strain: Not on file  . Food insecurity:    Worry: Not on file    Inability: Not on file  . Transportation needs:    Medical: Not on file    Non-medical: Not on file  Tobacco Use  . Smoking status: Current Every Day Smoker    Packs/day: 0.50  . Smokeless tobacco: Never Used  Substance and Sexual Activity  . Alcohol use: No    Alcohol/week: 2.0 standard drinks    Types: 2 Standard drinks or equivalent per week  . Drug use: No  . Sexual activity: Yes    Birth control/protection: None  Lifestyle  . Physical activity:    Days per week: Not on file    Minutes per session: Not on file  . Stress: Not on file  Relationships  . Social connections:    Talks on phone: Not on file    Gets together: Not on file    Attends religious service: Not on file    Active member of club or organization: Not on file    Attends meetings of clubs or organizations: Not on file    Relationship status: Not on  file  . Intimate partner violence:    Fear of current or ex partner: Not on file    Emotionally abused: Not on file    Physically abused: Not on file    Forced sexual activity: Not on file  Other Topics Concern  . Not on file  Social History Narrative  . Not on file    Review of Systems: Constitutional: Negative for fever, chills, diaphoresis, activity change, appetite change and fatigue. HENT: Negative for ear pain, nosebleeds, congestion, facial swelling, rhinorrhea, neck pain, neck stiffness and ear discharge.  Eyes: Negative for pain, discharge, redness, itching and visual disturbance. Respiratory: Negative for  cough, choking, chest tightness, shortness of breath, wheezing and stridor.  Cardiovascular: Negative for chest pain, palpitations and leg swelling. Gastrointestinal: Negative for abdominal distention. Genitourinary: Negative for dysuria, urgency, frequency, hematuria, flank pain, decreased urine volume, difficulty urinating. Musculoskeletal: Negative for back pain, joint swelling, arthralgia and gait problem. Neurological: Negative for dizziness, tremors, seizures, syncope, facial asymmetry, speech difficulty, weakness, light-headedness, numbness and headaches.  Hematological: Negative for adenopathy. Does not bruise/bleed easily. Psychiatric/Behavioral: Negative for hallucinations, behavioral problems, confusion, dysphoric mood, decreased concentration and agitation.    Objective:  There were no vitals filed for this visit.  BP Readings from Last 3 Encounters:  08/10/17 106/64  09/29/16 116/72  09/28/16 104/72    There were no vitals filed for this visit.    Physical Exam: Constitutional: Patient appears well-developed and well-nourished. No distress. HENT: Normocephalic, atraumatic, External right and left ear normal. Oropharynx is clear and moist.  Eyes: Conjunctivae and EOM are normal. PERRLA, no scleral icterus. Neck: Normal ROM. Neck supple. No JVD. No tracheal deviation. No thyromegaly. CVS: RRR, S1/S2 +, no murmurs, no gallops, no carotid bruit.  Pulmonary: Effort and breath sounds normal, no stridor, rhonchi, wheezes, rales.  Abdominal: Soft. BS +, no distension, tenderness, rebound or guarding.  Musculoskeletal: Normal range of motion. No edema and no tenderness.  Neuro: Alert. Normal muscle tone coordination. Normal gait. BUE and BLE strength 5/5.  Skin: hair thinning noted around the edges of hair line. Unable to evaluate scalp as patient has weave attached to natural hair. Skin is warm and dry. No rash noted. Not diaphoretic. No erythema. No pallor. Psychiatric: Normal  mood and affect. Behavior, judgment, thought content normal.  Lab Results (prior encounters)  Lab Results  Component Value Date   WBC 6.1 05/11/2016   HGB 12.2 05/11/2016   HCT 37.0 05/11/2016   MCV 92.5 05/11/2016   PLT 334 05/11/2016   Lab Results  Component Value Date   CREATININE 0.76 05/11/2016   BUN 13 05/11/2016   NA 141 05/11/2016   K 3.6 05/11/2016   CL 108 05/11/2016   CO2 27 05/11/2016       Assessment and plan:  1. Vaginal discharge - Cervicovaginal ancillary only - Comprehensive metabolic panel; Future - CBC with Differential; Future - Urinalysis, microscopic only  2. Hair loss - Thyroid Panel With TSH; Future, if normal will refer to dermatology - Ambulatory referral to Dermatology  3. Screening for diabetes mellitus - Hemoglobin A1c; Future - Comprehensive metabolic panel; Future  4. Screening, lipid - Lipid panel; Future  Return if symptoms worsen or fail to improve.   Meds ordered this encounter  Medications  . minoxidil (MINOXIDIL FOR WOMEN) 2 % external solution    Sig: Apply topically 2 (two) times daily.    Dispense:  60 mL    Refill:  0  . fluconazole (DIFLUCAN)  150 MG tablet    Sig: Take 1 tablet (150 mg total) by mouth once for 1 dose.    Dispense:  1 tablet    Refill:  0    Orders Placed This Encounter  Procedures  . Thyroid Panel With TSH    Standing Status:   Future    Number of Occurrences:   1    Standing Expiration Date:   11/06/2018  . Hemoglobin A1c    Standing Status:   Future    Number of Occurrences:   1    Standing Expiration Date:   11/06/2018  . Lipid panel    Standing Status:   Future    Number of Occurrences:   1    Standing Expiration Date:   11/06/2018    Order Specific Question:   Has the patient fasted?    Answer:   No  . Comprehensive metabolic panel    Standing Status:   Future    Number of Occurrences:   1    Standing Expiration Date:   11/06/2018    Order Specific Question:   Has the patient  fasted?    Answer:   No  . CBC with Differential    Standing Status:   Future    Number of Occurrences:   1    Standing Expiration Date:   11/06/2018  . Urinalysis, microscopic only  . Ambulatory referral to Dermatology    Referral Priority:   Routine    Referral Type:   Consultation    Referral Reason:   Specialty Services Required    Requested Specialty:   Dermatology    Number of Visits Requested:   1     The patient was given clear instructions to go to ER or return to medical center if symptoms don't improve, worsen or new problems develop. The patient verbalized understanding. The patient was advised  to call and obtain lab results if they haven't heard anything from out office within 7-10 business days.  Joaquin Courts, FNP [facility] [address], [place] [address] [phone]: [phone]    This note has been created with Dragon speech recognition software and Paediatric nurse. Any transcriptional errors are unintentional.

## 2017-11-05 NOTE — Patient Instructions (Addendum)
Thank you for choosing [facility] at [facility] for your medical home!    Ariel Pittman was seen by Molli Barrows, FNP today.   Ariel Pittman's primary care provider is Scot Jun, FNP.   For the best care possible,  you should try to see Molli Barrows, FNP  whenever you come to clinic.   We look forward to seeing you again soon!  If you have any questions about your visit today,  please call us at   Or feel free to reach your provider via [place].      You will be notified by phone of your lab results.   Health Maintenance, Female Adopting a healthy lifestyle and getting preventive care can go a long way to promote health and wellness. Talk with your health care provider about what schedule of regular examinations is right for you. This is a good chance for you to check in with your provider about disease prevention and staying healthy. In between checkups, there are plenty of things you can do on your own. Experts have done a lot of research about which lifestyle changes and preventive measures are most likely to keep you healthy. Ask your health care provider for more information. Weight and diet Eat a healthy diet  Be sure to include plenty of vegetables, fruits, low-fat dairy products, and lean protein.  Do not eat a lot of foods high in solid fats, added sugars, or salt.  Get regular exercise. This is one of the most important things you can do for your health. ? Most adults should exercise for at least 150 minutes each week. The exercise should increase your heart rate and make you sweat (moderate-intensity exercise). ? Most adults should also do strengthening exercises at least twice a week. This is in addition to the moderate-intensity exercise.  Maintain a healthy weight  Body mass index (BMI) is a measurement that can be used to identify possible weight problems. It estimates body fat based on height and weight. Your health care provider  can help determine your BMI and help you achieve or maintain a healthy weight.  For females 72 years of age and older: ? A BMI below 18.5 is considered underweight. ? A BMI of 18.5 to 24.9 is normal. ? A BMI of 25 to 29.9 is considered overweight. ? A BMI of 30 and above is considered obese.  Watch levels of cholesterol and blood lipids  You should start having your blood tested for lipids and cholesterol at 30 years of age, then have this test every 5 years.  You may need to have your cholesterol levels checked more often if: ? Your lipid or cholesterol levels are high. ? You are older than 30 years of age. ? You are at high risk for heart disease.  Cancer screening Lung Cancer  Lung cancer screening is recommended for adults 36-28 years old who are at high risk for lung cancer because of a history of smoking.  A yearly low-dose CT scan of the lungs is recommended for people who: ? Currently smoke. ? Have quit within the past 15 years. ? Have at least a 30-pack-year history of smoking. A pack year is smoking an average of one pack of cigarettes a day for 1 year.  Yearly screening should continue until it has been 15 years since you quit.  Yearly screening should stop if you develop a health problem that would prevent you from having lung cancer treatment.  Breast  Cancer  Practice breast self-awareness. This means understanding how your breasts normally appear and feel.  It also means doing regular breast self-exams. Let your health care provider know about any changes, no matter how small.  If you are in your 20s or 30s, you should have a clinical breast exam (CBE) by a health care provider every 1-3 years as part of a regular health exam.  If you are 85 or older, have a CBE every year. Also consider having a breast X-ray (mammogram) every year.  If you have a family history of breast cancer, talk to your health care provider about genetic screening.  If you are at high  risk for breast cancer, talk to your health care provider about having an MRI and a mammogram every year.  Breast cancer gene (BRCA) assessment is recommended for women who have family members with BRCA-related cancers. BRCA-related cancers include: ? Breast. ? Ovarian. ? Tubal. ? Peritoneal cancers.  Results of the assessment will determine the need for genetic counseling and BRCA1 and BRCA2 testing.  Cervical Cancer Your health care provider may recommend that you be screened regularly for cancer of the pelvic organs (ovaries, uterus, and vagina). This screening involves a pelvic examination, including checking for microscopic changes to the surface of your cervix (Pap test). You may be encouraged to have this screening done every 3 years, beginning at age 64.  For women ages 58-65, health care providers may recommend pelvic exams and Pap testing every 3 years, or they may recommend the Pap and pelvic exam, combined with testing for human papilloma virus (HPV), every 5 years. Some types of HPV increase your risk of cervical cancer. Testing for HPV may also be done on women of any age with unclear Pap test results.  Other health care providers may not recommend any screening for nonpregnant women who are considered low risk for pelvic cancer and who do not have symptoms. Ask your health care provider if a screening pelvic exam is right for you.  If you have had past treatment for cervical cancer or a condition that could lead to cancer, you need Pap tests and screening for cancer for at least 20 years after your treatment. If Pap tests have been discontinued, your risk factors (such as having a new sexual partner) need to be reassessed to determine if screening should resume. Some women have medical problems that increase the chance of getting cervical cancer. In these cases, your health care provider may recommend more frequent screening and Pap tests.  Colorectal Cancer  This type of cancer  can be detected and often prevented.  Routine colorectal cancer screening usually begins at 30 years of age and continues through 30 years of age.  Your health care provider may recommend screening at an earlier age if you have risk factors for colon cancer.  Your health care provider may also recommend using home test kits to check for hidden blood in the stool.  A small camera at the end of a tube can be used to examine your colon directly (sigmoidoscopy or colonoscopy). This is done to check for the earliest forms of colorectal cancer.  Routine screening usually begins at age 61.  Direct examination of the colon should be repeated every 5-10 years through 30 years of age. However, you may need to be screened more often if early forms of precancerous polyps or small growths are found.  Skin Cancer  Check your skin from head to toe regularly.  Tell your  health care provider about any new moles or changes in moles, especially if there is a change in a mole's shape or color.  Also tell your health care provider if you have a mole that is larger than the size of a pencil eraser.  Always use sunscreen. Apply sunscreen liberally and repeatedly throughout the day.  Protect yourself by wearing long sleeves, pants, a wide-brimmed hat, and sunglasses whenever you are outside.  Heart disease, diabetes, and high blood pressure  High blood pressure causes heart disease and increases the risk of stroke. High blood pressure is more likely to develop in: ? People who have blood pressure in the high end of the normal range (130-139/85-89 mm Hg). ? People who are overweight or obese. ? People who are African American.  If you are 35-26 years of age, have your blood pressure checked every 3-5 years. If you are 20 years of age or older, have your blood pressure checked every year. You should have your blood pressure measured twice-once when you are at a hospital or clinic, and once when you are not at  a hospital or clinic. Record the average of the two measurements. To check your blood pressure when you are not at a hospital or clinic, you can use: ? An automated blood pressure machine at a pharmacy. ? A home blood pressure monitor.  If you are between 72 years and 18 years old, ask your health care provider if you should take aspirin to prevent strokes.  Have regular diabetes screenings. This involves taking a blood sample to check your fasting blood sugar level. ? If you are at a normal weight and have a low risk for diabetes, have this test once every three years after 30 years of age. ? If you are overweight and have a high risk for diabetes, consider being tested at a younger age or more often. Preventing infection Hepatitis B  If you have a higher risk for hepatitis B, you should be screened for this virus. You are considered at high risk for hepatitis B if: ? You were born in a country where hepatitis B is common. Ask your health care provider which countries are considered high risk. ? Your parents were born in a high-risk country, and you have not been immunized against hepatitis B (hepatitis B vaccine). ? You have HIV or AIDS. ? You use needles to inject street drugs. ? You live with someone who has hepatitis B. ? You have had sex with someone who has hepatitis B. ? You get hemodialysis treatment. ? You take certain medicines for conditions, including cancer, organ transplantation, and autoimmune conditions.  Hepatitis C  Blood testing is recommended for: ? Everyone born from [birth year] through 1963. ? Anyone with known risk factors for hepatitis C.  Sexually transmitted infections (STIs)  You should be screened for sexually transmitted infections (STIs) including gonorrhea and chlamydia if: ? You are sexually active and are younger than 30 years of age. ? You are older than 30 years of age and your health care provider tells you that you are at risk for this type of  infection. ? Your sexual activity has changed since you were last screened and you are at an increased risk for chlamydia or gonorrhea. Ask your health care provider if you are at risk.  If you do not have HIV, but are at risk, it may be recommended that you take a prescription medicine daily to prevent HIV infection. This is called pre-exposure prophylaxis (  PrEP). You are considered at risk if: ? You are sexually active and do not regularly use condoms or know the HIV status of your partner(s). ? You take drugs by injection. ? You are sexually active with a partner who has HIV.  Talk with your health care provider about whether you are at high risk of being infected with HIV. If you choose to begin PrEP, you should first be tested for HIV. You should then be tested every 3 months for as long as you are taking PrEP. Pregnancy  If you are premenopausal and you may become pregnant, ask your health care provider about preconception counseling.  If you may become pregnant, take 400 to 800 micrograms (mcg) of folic acid every day.  If you want to prevent pregnancy, talk to your health care provider about birth control (contraception). Osteoporosis and menopause  Osteoporosis is a disease in which the bones lose minerals and strength with aging. This can result in serious bone fractures. Your risk for osteoporosis can be identified using a bone density scan.  If you are 42 years of age or older, or if you are at risk for osteoporosis and fractures, ask your health care provider if you should be screened.  Ask your health care provider whether you should take a calcium or vitamin D supplement to lower your risk for osteoporosis.  Menopause may have certain physical symptoms and risks.  Hormone replacement therapy may reduce some of these symptoms and risks. Talk to your health care provider about whether hormone replacement therapy is right for you. Follow these instructions at home:  Schedule  regular health, dental, and eye exams.  Stay current with your immunizations.  Do not use any tobacco products including cigarettes, chewing tobacco, or electronic cigarettes.  If you are pregnant, do not drink alcohol.  If you are breastfeeding, limit how much and how often you drink alcohol.  Limit alcohol intake to no more than 1 drink per day for nonpregnant women. One drink equals 12 ounces of beer, 5 ounces of wine, or 1 ounces of hard liquor.  Do not use street drugs.  Do not share needles.  Ask your health care provider for help if you need support or information about quitting drugs.  Tell your health care provider if you often feel depressed.  Tell your health care provider if you have ever been abused or do not feel safe at home. This information is not intended to replace advice given to you by your health care provider. Make sure you discuss any questions you have with your health care provider. Document Released: 07/15/2010 Document Revised: 06/07/2015 Document Reviewed: 10/03/2014 Elsevier Interactive Patient Education  2018 [place].      Alopecia Areata, Adult Alopecia areata is a condition that causes you to lose hair. You may lose hair on your scalp in patches. In some cases, you may lose all the hair on your scalp (alopecia totalis) or all the hair from your face and body (alopecia universalis). Alopecia areata is an autoimmune disease. This means that your body's defense system (immune system) mistakes normal parts of the body for germs or other things that can make you sick. When you have alopecia areata, the immune system attacks the hair follicles. Alopecia areata usually develops in childhood, but it can develop at any age. For some people, their hair grows back on its own and hair loss does not happen again. For others, their hair may fall out and grow back in cycles.  The hair loss may last many years. Having this condition can be emotionally difficult,  but it is not dangerous. What are the causes? The cause of this condition is not known. What increases the risk? This condition is more likely to develop in people who have:  A family history of alopecia.  A family history of another autoimmune disease, including type 1 diabetes and rheumatoid arthritis.  Asthma and allergies.  Down syndrome.  What are the signs or symptoms? Round spots of patchy hair loss on the scalp is the main symptom of this condition. The spots may be mildly itchy. Other symptoms include:  Short dark hairs in the bald patches that are wider at the top (exclamation point hairs).  Dents, white spots, or lines in the fingernails or toenails.  Balding and body hair loss. This is rare.  How is this diagnosed? This condition is diagnosed based on your symptoms and family history. Your health care provider will also check your scalp skin, teeth, and nails. Your health care provider may refer you to a specialist in hair and skin disorders (dermatologist). You may also have tests, including:  A hair pull test.  Blood tests or other screening tests to check for autoimmune diseases, such as thyroid disease or diabetes.  Skin biopsy to confirm the diagnosis.  A procedure to examine the skin with a lighted magnifying instrument (dermoscopy).  How is this treated? There is no cure for alopecia areata. Treatment is aimed at promoting the regrowth of hair and preventing the immune system from overreacting. No single treatment is right for all people with alopecia areata. It depends on the type of hair loss you have and how severe it is. Work with your health care provider to find the best treatment for you. Treatment may include:  Having regular checkups to make sure the condition is not getting worse (watchful waiting).  Steroid creams or pills for 6-8 weeks to stop the immune reaction and help hair to regrow more quickly.  Other topical medicines to alter the immune  system response and support the hair growth cycle.  Steroid injections.  Therapy and counseling with a support group or therapist if you are having trouble coping with hair loss.  Follow these instructions at home:  Learn as much as you can about your condition.  Apply topical creams only as told by your health care provider.  Take over-the-counter and prescription medicines only as told by your health care provider.  Consider getting a wig or products to make hair look fuller or to cover bald spots, if you feel uncomfortable with your appearance.  Get therapy or counseling if you are having a hard time coping with hair loss. Ask your health care provider to recommend a counselor or support group.  Keep all follow-up visits as told by your health care provider. This is important. Contact a health care provider if:  Your hair loss gets worse, even with treatment.  You have new symptoms.  You are struggling emotionally. Summary  Alopecia areata is an autoimmune condition that makes your body's defense system (immune system) attack the hair follicles. This causes you to lose hair.  Treatments may include regular checkups to make sure that the condition is not getting worse (watchful waiting), medicines, and steroid injections. This information is not intended to replace advice given to you by your health care provider. Make sure you discuss any questions you have with your health care provider. Document Released: 08/04/2003 Document Revised: 01/18/2016 Document  Reviewed: 01/18/2016 Elsevier Interactive Patient Education  [employer].

## 2017-11-06 LAB — CERVICOVAGINAL ANCILLARY ONLY
BACTERIAL VAGINITIS: NEGATIVE
Candida vaginitis: POSITIVE — AB
Chlamydia: NEGATIVE
NEISSERIA GONORRHEA: NEGATIVE
Trichomonas: NEGATIVE

## 2017-11-06 LAB — LIPID PANEL
CHOLESTEROL TOTAL: 110 mg/dL (ref 100–199)
Chol/HDL Ratio: 2.3 ratio (ref 0.0–4.4)
HDL: 48 mg/dL (ref >39)
LDL CALC: 48 mg/dL (ref 0–99)
TRIGLYCERIDES: 69 mg/dL (ref 0–149)
VLDL CHOLESTEROL CAL: 14 mg/dL (ref 5–40)

## 2017-11-06 LAB — COMPREHENSIVE METABOLIC PANEL
ALK PHOS: 73 IU/L (ref 39–117)
ALT: 7 IU/L (ref 0–32)
AST: 16 IU/L (ref 0–40)
Albumin/Globulin Ratio: 1.4 (ref 1.2–2.2)
Albumin: 4.4 g/dL (ref 3.5–5.5)
BUN/Creatinine Ratio: 13 (ref 9–23)
BUN: 10 mg/dL (ref 6–20)
Bilirubin Total: 0.2 mg/dL (ref 0.0–1.2)
CO2: 24 mmol/L (ref 20–29)
CREATININE: 0.78 mg/dL (ref 0.57–1.00)
Calcium: 9.4 mg/dL (ref 8.7–10.2)
Chloride: 103 mmol/L (ref 96–106)
GFR calc Af Amer: 118 mL/min/{1.73_m2} (ref >59)
GFR calc non Af Amer: 102 mL/min/{1.73_m2} (ref >59)
Globulin, Total: 3.1 g/dL (ref 1.5–4.5)
Glucose: 88 mg/dL (ref 65–99)
Potassium: 4.2 mmol/L (ref 3.5–5.2)
Sodium: 141 mmol/L (ref 134–144)
Total Protein: 7.5 g/dL (ref 6.0–8.5)

## 2017-11-06 LAB — THYROID PANEL WITH TSH
FREE THYROXINE INDEX: 2.1 (ref 1.2–4.9)
T3 Uptake Ratio: 27 % (ref 24–39)
T4, Total: 7.8 ug/dL (ref 4.5–12.0)
TSH: 1.73 u[IU]/mL (ref 0.450–4.500)

## 2017-11-06 LAB — CBC WITH DIFFERENTIAL/PLATELET
BASOS: 0 %
Basophils Absolute: 0 10*3/uL (ref 0.0–0.2)
EOS (ABSOLUTE): 0.1 10*3/uL (ref 0.0–0.4)
EOS: 2 %
HEMATOCRIT: 38.1 % (ref 34.0–46.6)
HEMOGLOBIN: 12.4 g/dL (ref 11.1–15.9)
IMMATURE GRANULOCYTES: 0 %
Immature Grans (Abs): 0 10*3/uL (ref 0.0–0.1)
LYMPHS ABS: 2.1 10*3/uL (ref 0.7–3.1)
Lymphs: 41 %
MCH: 29.8 pg (ref 26.6–33.0)
MCHC: 32.5 g/dL (ref 31.5–35.7)
MCV: 92 fL (ref 79–97)
MONOCYTES: 7 %
MONOS ABS: 0.3 10*3/uL (ref 0.1–0.9)
Neutrophils Absolute: 2.6 10*3/uL (ref 1.4–7.0)
Neutrophils: 50 %
Platelets: 340 10*3/uL (ref 150–450)
RBC: 4.16 x10E6/uL (ref 3.77–5.28)
RDW: 10.6 % — ABNORMAL LOW (ref 12.3–15.4)
WBC: 5.2 10*3/uL (ref 3.4–10.8)

## 2017-11-06 LAB — URINALYSIS, MICROSCOPIC ONLY: CASTS: NONE SEEN /LPF

## 2017-11-06 LAB — HEMOGLOBIN A1C
Est. average glucose Bld gHb Est-mCnc: 91 mg/dL
Hgb A1c MFr Bld: 4.8 % (ref 4.8–5.6)

## 2017-11-06 MED ORDER — MINOXIDIL 2 % EX SOLN: Freq: Two times a day (BID) | CUTANEOUS | 0 refills | Status: DC

## 2017-11-06 NOTE — Progress Notes (Signed)
Patient notified of results & recommendations. Expressed understanding.

## 2017-11-06 NOTE — Progress Notes (Signed)
Patient notified of results. Expressed understanding.

## 2017-11-07 DIAGNOSIS — L659 Nonscarring hair loss, unspecified: Secondary | ICD-10-CM | POA: Insufficient documentation

## 2017-11-07 MED ORDER — FLUCONAZOLE 150 MG PO TABS: 150.0000 mg | ORAL_TABLET | Freq: Once | ORAL | 0 refills | Status: AC

## 2017-11-12 NOTE — Progress Notes (Signed)
Patient notified of results & recommendations. Expressed understanding.

## 2017-11-19 ENCOUNTER — Ambulatory Visit (INDEPENDENT_AMBULATORY_CARE_PROVIDER_SITE_OTHER): Payer: Self-pay | Admitting: Family Medicine

## 2017-11-19 ENCOUNTER — Encounter: Payer: Self-pay | Admitting: Family Medicine

## 2017-11-19 DIAGNOSIS — M545 Low back pain: Secondary | ICD-10-CM

## 2017-11-19 MED ORDER — CYCLOBENZAPRINE HCL 10 MG PO TABS: 10.0000 mg | ORAL_TABLET | Freq: Every day | ORAL | 0 refills | Status: DC

## 2017-11-19 MED ORDER — MELOXICAM 15 MG PO TABS: 15.0000 mg | ORAL_TABLET | Freq: Every day | ORAL | 0 refills | Status: DC
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
## 2017-11-20 ENCOUNTER — Telehealth: Payer: Self-pay | Admitting: Family Medicine

## 2017-11-20 NOTE — Telephone Encounter (Signed)
Patient received a phone call from [facility] dermatology however reports that she is not able to be seen until earlier in the new year.  She is requesting a referral to a different dermatology office.  Please contact [facility] and or [facility] to see either clinical except self-pay patients.  Follow-up with patient to advise.

## 2017-11-20 NOTE — Telephone Encounter (Signed)
Faxed referral packet to [facility].

## 2018-02-03 ENCOUNTER — Encounter: Payer: Self-pay | Admitting: Emergency Medicine

## 2018-02-03 ENCOUNTER — Ambulatory Visit
Admission: EM | Admit: 2018-02-03 | Discharge: 2018-02-03 | Disposition: A | Discharge status: Home / Self Care | Payer: Medicaid Other | Attending: Family Medicine | Admitting: Family Medicine

## 2018-02-03 DIAGNOSIS — Z711 Person with feared health complaint in whom no diagnosis is made: Secondary | ICD-10-CM | POA: Insufficient documentation

## 2018-02-03 NOTE — ED Provider Notes (Signed)
[facility]    CSN: [id number] Arrival date & time: 02/03/18  1545     History   Chief Complaint Chief Complaint  Patient presents with  . Abnormal Lab    HPI Ariel Pittman is a 33 y.o. female.   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  She had cholesterol values and blood pressure.  They were both flagged as abnormal.  1 of the cholesterol values was marked that she needed medical attention.  She is here to have these interpreted. Interestingly her blood pressure was 118/80.  This got flagged as abnormal because of the diastolic number.  I explained to her that a blood pressure of 118/80 is actually desirable and that is not cause any alarm. The other value that was flagged was an total cholesterol of 105.  This was flagged as abnormally low.  The last cholesterol of record in her chart was 110.  I explained to her that she appears to have a naturally low cholesterol, and that these will decrease her risk of developing vascular disease rather than increase. Feels well and has no complaint.  Past Medical History:  Diagnosis Date  . No pertinent past medical history   . PONV (postoperative nausea and vomiting)     Patient Active Problem List   Diagnosis Date Noted  . Hair loss 11/07/2017    Past Surgical History:  Procedure Laterality Date  . CERVICAL CERCLAGE    . CESAREAN SECTION    . WISDOM TOOTH EXTRACTION Bilateral     OB History    Gravida  4   Para  2   Term      Preterm  2   AB  2   Living  2     SAB  1   TAB  1   Ectopic      Multiple      Live Births  2            Home Medications    Prior to Admission medications   Not on File    Family History Family History  Problem Relation Age of Onset  . Asthma Mother     Social History Social History   Tobacco Use  . Smoking status: Former Smoker    Packs/day: 0.50    Last attempt to quit: 02/13/2017   Years since quitting: 0.9  . Smokeless tobacco: Never Used  Substance Use Topics  . Alcohol use: No    Alcohol/week: 2.0 standard drinks    Types: 2 Standard drinks or equivalent per week  . Drug use: No     Allergies   Tomato   Review of Systems Review of Systems  Constitutional: Negative for chills and fever.  HENT: Negative for ear pain and sore throat.   Eyes: Negative for pain and visual disturbance.  Respiratory: Negative for cough and shortness of breath.   Cardiovascular: Negative for chest pain and palpitations.  Gastrointestinal: Negative for abdominal pain and vomiting.  Genitourinary: Negative for dysuria and hematuria.  Musculoskeletal: Negative for arthralgias and back pain.  Skin: Negative for color change and rash.  Neurological: Negative for seizures and syncope.  All other systems reviewed and are negative.    Physical Exam Triage Vital Signs ED Triage Vitals  Enc Vitals Group     BP 02/03/18 1553 117/80     Pulse Rate 02/03/18 1553 93     Resp  02/03/18 1553 18     Temp 02/03/18 1553 98.2 F (36.8 C)     Temp src --      SpO2 02/03/18 1553 97 %     Weight --      Height --      Head Circumference --      Peak Flow --      Pain Score 02/03/18 1555 0     Pain Loc --      Pain Edu? --      Excl. in GC? --    No data found.  Updated Vital Signs BP 117/80   Pulse 93   Temp 98.2 F (36.8 C)   Resp 18   SpO2 97%       Physical Exam Constitutional:      General: She is not in acute distress.    Appearance: Normal appearance. She is well-developed and normal weight.  HENT:     Head: Normocephalic and atraumatic.     Right Ear: Tympanic membrane normal.     Left Ear: Tympanic membrane normal.     Nose: Nose normal.  Eyes:     Conjunctiva/sclera: Conjunctivae normal.     Pupils: Pupils are equal, round, and reactive to light.  Neck:     Musculoskeletal: Normal range of motion.  Cardiovascular:     Rate and Rhythm: Normal rate and  regular rhythm.     Heart sounds: Normal heart sounds.  Pulmonary:     Effort: Pulmonary effort is normal. No respiratory distress.     Breath sounds: Normal breath sounds.  Abdominal:     General: There is no distension.     Palpations: Abdomen is soft.  Musculoskeletal: Normal range of motion.  Skin:    General: Skin is warm and dry.  Neurological:     General: No focal deficit present.     Mental Status: She is alert. Mental status is at baseline.  Psychiatric:        Mood and Affect: Mood normal.        Thought Content: Thought content normal.      UC Treatments / Results  Labs (all labs ordered are listed, but only abnormal results are displayed) Labs Reviewed - No data to display  EKG None  Radiology No results found.  Procedures Procedures (including critical care time)  Medications Ordered in UC Medications - No data to display  Initial Impression / Assessment and Plan / UC Course  I have reviewed the triage vital signs and the nursing notes.  Pertinent labs & imaging results that were available during my care of the patient were reviewed by me and considered in my medical decision making (see chart for details).      Final Clinical Impressions(s) / UC Diagnoses   Final diagnoses:  Physically well but worried     Discharge Instructions       Ref Range & Units 39mo ago  Cholesterol, Total 100 - 199 mg/dL 161110   Triglycerides 0 - 149 mg/dL 69   HDL >09>39 mg/dL 48   VLDL Cholesterol Cal 5 - 40 mg/dL 14   LDL Calculated 0 - 99 mg/dL 48   Chol/HDL Ratio 0.0 - 4.4 ratio 2.3   Comment:                                   T. Chol/HDL Ratio = less than half average  risk of heart disease                                        As you can see by the numbers below, you have NORMAL cholesterol, naturally LOW .  This places you at decreased risk of the development of heart disease.  Your new numbers are similar.  The blood pressure information attached shows  that a blood pressure of 118/80 is also NORMAL.  Keep up the good work.  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   ED Prescriptions    None     Controlled Substance Prescriptions [place] Controlled Substance Registry consulted? Not Applicable   Eustace Moore, MD 02/03/18 [phone]

## 2018-02-03 NOTE — Discharge Instructions (Addendum)
Ref Range & Units 31mo ago  Cholesterol, Total 100 - 199 mg/dL 725   Triglycerides 0 - 149 mg/dL 69   HDL >36 mg/dL 48   VLDL Cholesterol Cal 5 - 40 mg/dL 14   LDL Calculated 0 - 99 mg/dL 48   Chol/HDL Ratio 0.0 - 4.4 ratio 2.3   Comment:                                   T. Chol/HDL Ratio = less than half average risk of heart disease                                        As you can see by the numbers below, you have NORMAL cholesterol, naturally LOW .  This places you at decreased risk of the development of heart disease.  Your new numbers are similar.  The blood pressure information attached shows that a blood pressure of 118/80 is also NORMAL.  Keep up the good work.  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

## 2018-02-03 NOTE — ED Triage Notes (Signed)
Pt states she was doing a "job screening" and had labs and vitals done and it marked it as "abnormal" her BP of 118/80 in the office and cholesterol around 100. Pt states the labs scared her so she is here to be seen.

## 2018-02-16 ENCOUNTER — Ambulatory Visit: Payer: 59 | Attending: Family Medicine

## 2018-12-04 ENCOUNTER — Emergency Department (HOSPITAL_COMMUNITY): Payer: No Typology Code available for payment source

## 2018-12-04 ENCOUNTER — Other Ambulatory Visit: Payer: Self-pay

## 2018-12-04 ENCOUNTER — Encounter (HOSPITAL_COMMUNITY): Payer: Self-pay

## 2018-12-04 ENCOUNTER — Emergency Department (HOSPITAL_COMMUNITY)
Admission: EM | Admit: 2018-12-04 | Discharge: 2018-12-04 | Disposition: A | Discharge status: Home / Self Care | Payer: No Typology Code available for payment source | Attending: Emergency Medicine | Admitting: Emergency Medicine

## 2018-12-04 DIAGNOSIS — R220 Localized swelling, mass and lump, head: Secondary | ICD-10-CM | POA: Diagnosis present

## 2018-12-04 DIAGNOSIS — R0989 Other specified symptoms and signs involving the circulatory and respiratory systems: Secondary | ICD-10-CM | POA: Diagnosis not present

## 2018-12-04 DIAGNOSIS — J029 Acute pharyngitis, unspecified: Secondary | ICD-10-CM | POA: Insufficient documentation

## 2018-12-04 DIAGNOSIS — Z87891 Personal history of nicotine dependence: Secondary | ICD-10-CM | POA: Diagnosis not present

## 2018-12-04 LAB — CBC WITH DIFFERENTIAL/PLATELET
Abs Immature Granulocytes: 0.01 10*3/uL (ref 0.00–0.07)
Basophils Absolute: 0 10*3/uL (ref 0.0–0.1)
Basophils Relative: 0 %
Eosinophils Absolute: 0.1 10*3/uL (ref 0.0–0.5)
Eosinophils Relative: 1 %
HCT: 40.2 % (ref 36.0–46.0)
Hemoglobin: 13 g/dL (ref 12.0–15.0)
Immature Granulocytes: 0 %
Lymphocytes Relative: 29 %
Lymphs Abs: 1.9 10*3/uL (ref 0.7–4.0)
MCH: 30.7 pg (ref 26.0–34.0)
MCHC: 32.3 g/dL (ref 30.0–36.0)
MCV: 95 fL (ref 80.0–100.0)
Monocytes Absolute: 0.4 10*3/uL (ref 0.1–1.0)
Monocytes Relative: 6 %
Neutro Abs: 4.2 10*3/uL (ref 1.7–7.7)
Neutrophils Relative %: 64 %
Platelets: 341 10*3/uL (ref 150–400)
RBC: 4.23 MIL/uL (ref 3.87–5.11)
RDW: 11.3 % — ABNORMAL LOW (ref 11.5–15.5)
WBC: 6.6 10*3/uL (ref 4.0–10.5)
nRBC: 0 % (ref 0.0–0.2)

## 2018-12-04 LAB — BASIC METABOLIC PANEL
Anion gap: 9 (ref 5–15)
BUN: 11 mg/dL (ref 6–20)
CO2: 24 mmol/L (ref 22–32)
Calcium: 9.3 mg/dL (ref 8.9–10.3)
Chloride: 104 mmol/L (ref 98–111)
Creatinine, Ser: 0.8 mg/dL (ref 0.44–1.00)
GFR calc Af Amer: >60 mL/min (ref >60)
GFR calc non Af Amer: >60 mL/min (ref >60)
Glucose, Bld: 87 mg/dL (ref 70–99)
Potassium: 3.4 mmol/L — ABNORMAL LOW (ref 3.5–5.1)
Sodium: 137 mmol/L (ref 135–145)

## 2018-12-04 LAB — TSH: TSH: 1.116 u[IU]/mL (ref 0.350–4.500)

## 2018-12-04 LAB — I-STAT BETA HCG BLOOD, ED (MC, WL, AP ONLY): I-stat hCG, quantitative: <5 m[IU]/mL (ref <5)

## 2018-12-04 MED ORDER — DIPHENHYDRAMINE HCL 50 MG/ML IJ SOLN
25.0000 mg | Freq: Once | INTRAMUSCULAR | Status: AC
  Administered 2018-12-04: 19:00:00 25 mg via INTRAVENOUS
  Filled 2018-12-04: qty 1

## 2018-12-04 MED ORDER — METHYLPREDNISOLONE SODIUM SUCC 125 MG IJ SOLR
125.0000 mg | Freq: Once | INTRAMUSCULAR | Status: AC
  Administered 2018-12-04: 19:00:00 125 mg via INTRAVENOUS
  Filled 2018-12-04: qty 2

## 2018-12-04 MED ORDER — FAMOTIDINE 20 MG PO TABS: 20.0000 mg | ORAL_TABLET | Freq: Two times a day (BID) | ORAL | 0 refills | Status: AC

## 2018-12-04 MED ORDER — SODIUM CHLORIDE (PF) 0.9 % IJ SOLN
INTRAMUSCULAR | Status: AC
  Filled 2018-12-04: qty 50

## 2018-12-04 MED ORDER — ONDANSETRON HCL 4 MG/2ML IJ SOLN
4.0000 mg | Freq: Once | INTRAMUSCULAR | Status: AC
  Administered 2018-12-04: 4 mg via INTRAVENOUS
  Filled 2018-12-04: qty 2

## 2018-12-04 MED ORDER — FAMOTIDINE IN NACL 20-0.9 MG/50ML-% IV SOLN
20.0000 mg | Freq: Once | INTRAVENOUS | Status: AC
  Administered 2018-12-04: 19:00:00 20 mg via INTRAVENOUS
  Filled 2018-12-04: qty 50

## 2018-12-04 MED ORDER — IOHEXOL 300 MG/ML  SOLN
75.0000 mL | Freq: Once | INTRAMUSCULAR | Status: AC | PRN
  Administered 2018-12-04: 75 mL via INTRAVENOUS

## 2018-12-04 MED ORDER — DIPHENHYDRAMINE HCL 25 MG PO TABS: 25.0000 mg | ORAL_TABLET | Freq: Four times a day (QID) | ORAL | 0 refills | Status: AC

## 2018-12-04 MED ORDER — SODIUM CHLORIDE 0.9 % IV BOLUS
1000.0000 mL | Freq: Once | INTRAVENOUS | Status: AC
  Administered 2018-12-04: 19:00:00 1000 mL via INTRAVENOUS

## 2018-12-04 NOTE — ED Notes (Signed)
Pt verbalizes understanding of DC instructions. Pt belongings returned and is ambulatory out of ED.  

## 2018-12-04 NOTE — ED Triage Notes (Signed)
Pt presents with c/o throat swelling for a couple of months. Pt reports she does have a lot of allergies so she is unable to pinpoint if she has come into contact with anything. Pt reports that this morning, the swelling became worse. Pt is in no respiratory distress, talking on complete sentences.

## 2018-12-04 NOTE — Discharge Instructions (Signed)
Follow-up with your primary care doctor as directed.  You can continue taking Benadryl/Pepcid as directed.  As we discussed, you may need benefit from ENT follow-up.  Return the emergency department for any fever, difficulty breathing, vomiting or any other worsening or concerning symptoms.

## 2018-12-04 NOTE — ED Provider Notes (Signed)
[facility]-EMERGENCY DEPT Provider Note   CSN: [id number] Arrival date & time: 12/04/18  1655     History   Chief Complaint Chief Complaint  Patient presents with  . Oral Swelling    HPI Ariel Pittman is a 33 y.o. female who presents for evaluation of sensation of throat swelling and sore throat.  She states that this has been an ongoing issue for the last 2 years.  She states that she has felt like her throat continues to swell.  Today, she felt like it was more painful and she could not get the pain to go away.  She states that she is still able to tolerate her secretions and tolerate p.o. without any difficulty.  She states that she has not any recent fevers, congestion.  She feels like her neck is swollen and that there is something on the inside of her neck that is causing her throat to be irritated.  She did see her primary care doctor the beginning of this year but they could not find any reason as to why this is happening.  Patient states she continues to experience symptoms.  Today when the pain was worse and would not go away, she became frustrated and decided to come to the emergency department. She states she has a long history of allergies to foods, dust, environmental factors.  She does not know of any known contact but states it may be possible.  She has not noticed any swelling of her tongue or lips.  She has not any difficulty breathing or vomiting.  Patient denies any chest pain.  Patient states she also is concerned about her thyroid.  She states that she feels like her hair falls all the time that she is tired.  She states that she has not been tested.     The history is provided by the patient.    Past Medical History:  Diagnosis Date  . No pertinent past medical history   . PONV (postoperative nausea and vomiting)     Patient Active Problem List   Diagnosis Date Noted  . Hair loss 11/07/2017    Past Surgical History:  Procedure  Laterality Date  . CERVICAL CERCLAGE    . CESAREAN SECTION    . WISDOM TOOTH EXTRACTION Bilateral      OB History    Gravida  4   Para  2   Term      Preterm  2   AB  2   Living  2     SAB  1   TAB  1   Ectopic      Multiple      Live Births  2            Home Medications    Prior to Admission medications   Medication Sig Start Date End Date Taking? Authorizing Provider  diphenhydrAMINE (BENADRYL) 25 MG tablet Take 1 tablet (25 mg total) by mouth every 6 (six) hours. 12/04/18   Maxwell Caul, PA-C  famotidine (PEPCID) 20 MG tablet Take 1 tablet (20 mg total) by mouth 2 (two) times daily. 12/04/18   Maxwell Caul, PA-C    Family History Family History  Problem Relation Age of Onset  . Asthma Mother     Social History Social History   Tobacco Use  . Smoking status: Former Smoker    Packs/day: 0.50    Quit date: 02/13/2017    Years since quitting: 1.8  .  Smokeless tobacco: Never Used  Substance Use Topics  . Alcohol use: No    Alcohol/week: 2.0 standard drinks    Types: 2 Standard drinks or equivalent per week  . Drug use: No     Allergies   Tomato   Review of Systems Review of Systems  Constitutional: Negative for fever.  HENT: Positive for facial swelling and sore throat. Negative for trouble swallowing.   Respiratory: Negative for cough and shortness of breath.   Cardiovascular: Negative for chest pain.  Gastrointestinal: Negative for abdominal pain, nausea and vomiting.  Neurological: Negative for headaches.  All other systems reviewed and are negative.    Physical Exam Updated Vital Signs BP 122/84   Pulse 100   Temp 98.6 F (37 C) (Oral)   Resp 16   Ht 5\' 5"  (1.651 m)   Wt 81.6 kg   LMP 11/06/2018 (Approximate)   SpO2 99%   BMI 29.95 kg/m   Physical Exam Vitals signs and nursing note reviewed.  Constitutional:      Appearance: Normal appearance. She is well-developed.     Comments: Very tearful  HENT:      Head: Normocephalic and atraumatic.     Mouth/Throat:     Mouth: Mucous membranes are moist.     Pharynx: Oropharynx is clear.     Comments: Posterior oropharynx is clear.  Uvula is midline.  Airways patent. Eyes:     General: Lids are normal.     Conjunctiva/sclera: Conjunctivae normal.     Pupils: Pupils are equal, round, and reactive to light.  Neck:     Musculoskeletal: Normal range of motion.     Trachea: Phonation normal.      Comments: There is some slight mild soft tissue swelling of the neck, particularly on the right side greater than left.  No overlying warmth, erythema.  No crepitus.  No obvious cervical lymphadenopathy.  No palpable mass.  Palpation of the thyroid did not show any tenderness or mass noted.  No tracheal deviation.  She is able to swallow.  Airways patent, phonation is intact. Cardiovascular:     Rate and Rhythm: Normal rate and regular rhythm.     Pulses: Normal pulses.     Heart sounds: Normal heart sounds. No murmur. No friction rub. No gallop.   Pulmonary:     Effort: Pulmonary effort is normal.     Breath sounds: Normal breath sounds.     Comments: Lungs clear to auscultation bilaterally.  Symmetric chest rise.  No wheezing, rales, rhonchi. Abdominal:     Palpations: Abdomen is soft. Abdomen is not rigid.     Tenderness: There is no abdominal tenderness. There is no guarding.  Musculoskeletal: Normal range of motion.  Skin:    General: Skin is warm and dry.     Capillary Refill: Capillary refill takes less than 2 seconds.  Neurological:     Mental Status: She is alert and oriented to person, place, and time.  Psychiatric:        Speech: Speech normal.      ED Treatments / Results  Labs (all labs ordered are listed, but only abnormal results are displayed) Labs Reviewed  BASIC METABOLIC PANEL - Abnormal; Notable for the following components:      Result Value   Potassium 3.4 (*)    All other components within normal limits  CBC WITH  DIFFERENTIAL/PLATELET - Abnormal; Notable for the following components:   RDW 11.3 (*)    All other components  within normal limits  TSH  I-STAT BETA HCG BLOOD, ED ([facility], [facility], [facility] ONLY)    EKG None  Radiology Ct Soft Tissue Neck W Contrast  Result Date: 12/04/2018 CLINICAL DATA:  Throat swelling for several months.  Neck swelling. EXAM: CT NECK WITH CONTRAST TECHNIQUE: Multidetector CT imaging of the neck was performed using the standard protocol following the bolus administration of intravenous contrast. CONTRAST:  75mL OMNIPAQUE IOHEXOL 300 MG/ML  SOLN COMPARISON:  None. FINDINGS: Pharynx and larynx: Normal. No mass or swelling. Salivary glands: No inflammation, mass, or stone. Thyroid: Negative Lymph nodes: No enlarged lymph nodes in the neck. Vascular: Normal vascular enhancement Limited intracranial: Negative Visualized orbits: Negative Mastoids and visualized paranasal sinuses: Mild mucosal edema in the paranasal sinuses. Mastoid clear bilaterally. Skeleton: Negative Upper chest: Lung apices clear bilaterally. Other: None IMPRESSION: Negative CT neck.  No evidence of pharyngeal mass or edema. Electronically Signed   By: Marlan Palauharles  Clark M.D.   On: 12/04/2018 21:08    Procedures Procedures (including critical care time)  Medications Ordered in ED Medications  sodium chloride (PF) 0.9 % injection (has no administration in time range)  sodium chloride 0.9 % bolus 1,000 mL (0 mLs Intravenous Stopped 12/04/18 2124)  ondansetron (ZOFRAN) injection 4 mg (4 mg Intravenous Given 12/04/18 1852)  diphenhydrAMINE (BENADRYL) injection 25 mg (25 mg Intravenous Given 12/04/18 1851)  famotidine (PEPCID) IVPB 20 mg premix (0 mg Intravenous Stopped 12/04/18 2003)  methylPREDNISolone sodium succinate (SOLU-MEDROL) 125 mg/2 mL injection 125 mg (125 mg Intravenous Given 12/04/18 1854)  iohexol (OMNIPAQUE) 300 MG/ML solution 75 mL (75 mLs Intravenous Contrast Given 12/04/18 2033)     Initial Impression /  Assessment and Plan / ED Course  I have reviewed the triage vital signs and the nursing notes.  Pertinent labs & imaging results that were available during my care of the patient were reviewed by me and considered in my medical decision making (see chart for details).        33 year old female who presents for evaluation of throat swelling sensation and sore throat.  She states that this is been ongoing for several years.  She saw her primary care doctor earlier this year.  Today, she felt like it was worse and felt like she swelling and pain in her throat has become more severe.  She does have history of allergies and is unsure if she had any exposure.  No new medications or otherwise contacts.  Patient denies any vomiting, difficulty tolerating secretions.  Initial ED arrival, she is afebrile but is slightly tachycardic.  She is very anxious and tearful on my evaluation.  She does have some slight soft tissue swelling on the right side.  This is stable and does not appear infected as there is no overlying warmth, erythema.  Additionally, there is no evidence of subcutaneous emphysema.  No crepitus.  Posterior pharynx is clear.  History/physical exam is not concerning for peritonsillar abscess, Ludwig angina.  Additionally, this is not concerning for abscess, extending hematoma.  She is very concerned and feels like there is something in her throat.  Given chronicity of symptoms as well as some mild soft tissue swelling, will plan for imaging to ensure that there is no mass.  At this time, given the chronicity of symptoms, do not suspect this to be an acute allergic reaction.  She has no oral angioedema.  Do not feel that epinephrine is warranted in this case.  I-STAT beta negative.  BMP shows potassium of 3.4.  CBC without any significant leukocytosis or anemia.  TSH is within normal limits.  CT soft tissue neck shows no acute abnormalities.  Discussed results with patient.  She is resting  comfortably.  She is hemodynamically stable with no signs of distress.  She is tolerating p.o. without any difficulty and is having no trouble tolerating her secretions.  At this time, given that this has been an ongoing issue for 2 years, do not feel that this warrants admission.  At this time, her history/physical exam is not concerning for a oral angioedema, allergic reaction.  Do not feel that she needs further imaging.  I discussed with her that she will need to follow-up with her primary care doctor.  Additionally, given that her symptoms have been so ongoing, we will give her ear nose and throat doctor to follow-up with. At this time, patient exhibits no emergent life-threatening condition that require further evaluation in ED or admission. Patient had ample opportunity for questions and discussion. All patient's questions were answered with full understanding.  Portions of this note were generated with Scientist, clinical (histocompatibility and immunogenetics). Dictation errors may occur despite best attempts at proofreading.   Final Clinical Impressions(s) / ED Diagnoses   Final diagnoses:  Globus pharyngeus    ED Discharge Orders         Ordered    diphenhydrAMINE (BENADRYL) 25 MG tablet  Every 6 hours     12/04/18 2122    famotidine (PEPCID) 20 MG tablet  2 times daily     12/04/18 2122           Rosana Hoes 12/04/18 2127    Pricilla Loveless, MD 12/06/18 1007

## 2018-12-04 NOTE — ED Notes (Signed)
PT off floor in CT

## 2021-07-27 IMAGING — CT CT NECK W/ CM
3 of 4 series · 12 of 33 positions shown, 14 images · IV contrast (omnipaque)
Comparison: None.

CLINICAL DATA: Throat swelling for several months.  Neck swelling.

EXAM:
CT NECK WITH CONTRAST
TECHNIQUE: Multidetector CT imaging of the neck was performed using the
standard protocol following the bolus administration of intravenous
contrast.
CONTRAST:  75mL OMNIPAQUE IOHEXOL 300 MG/ML  SOLN

[Series 5: axial · axial · 0.38mm/px · z∈[-304,-117]mm · 4 of 140 slices shown, 5 images]
[im 20/140  soft-tissue]
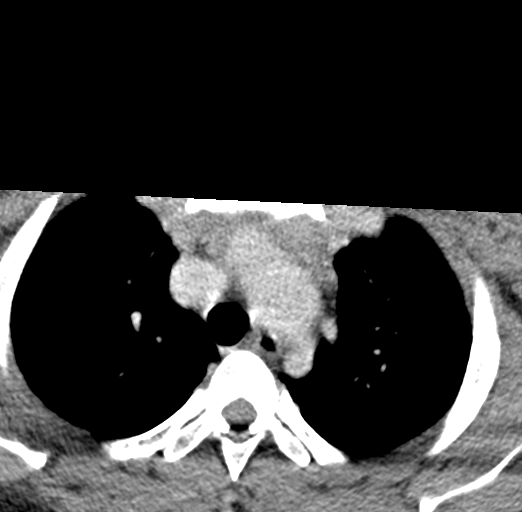
[im 20/140  bone]
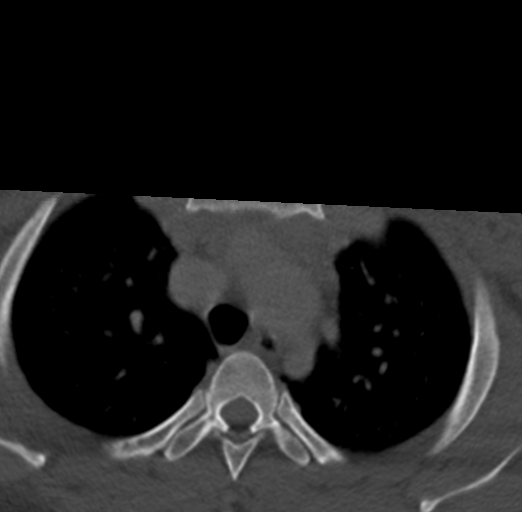
[im 60/140  bone]
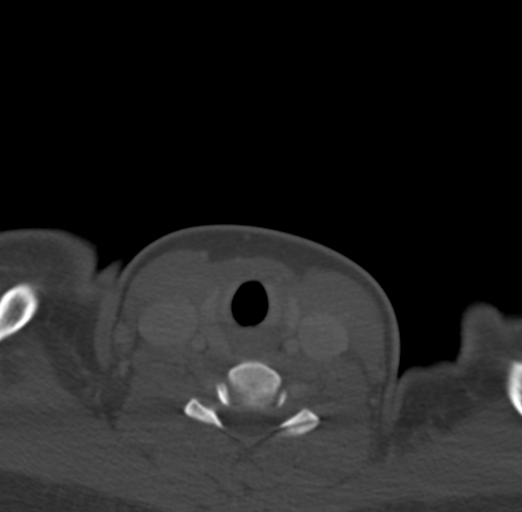
[im 80/140  bone]
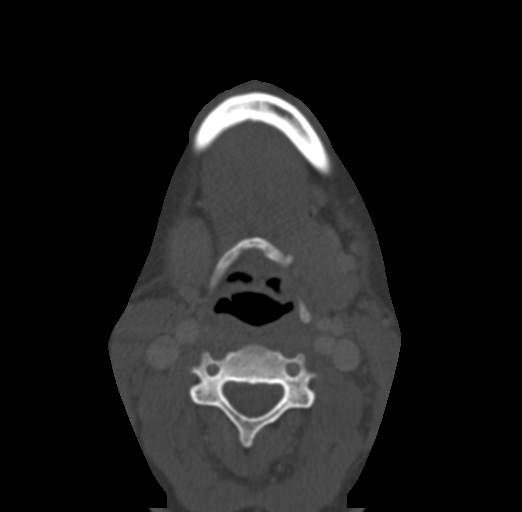
[im 120/140  bone]
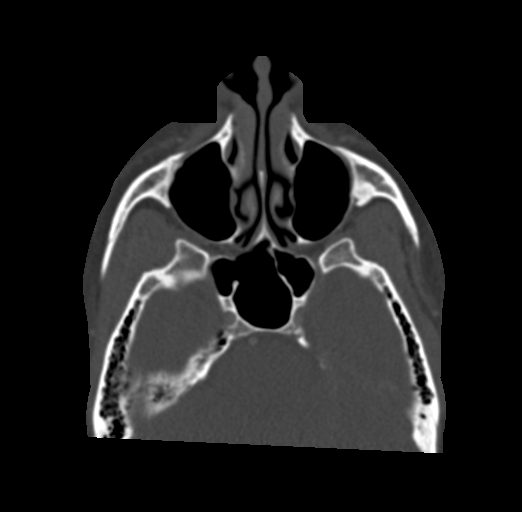

[Series 6: coronal · coronal · 0.37mm/px · 3 of 99 slices shown]
[im 32/99  bone]
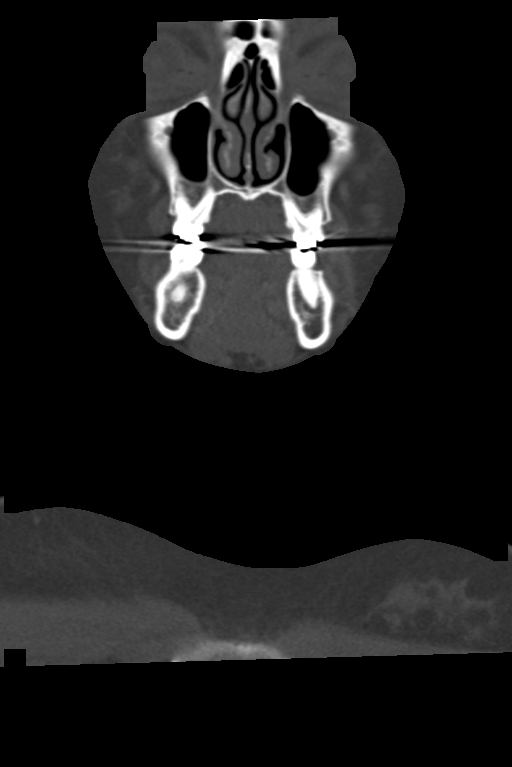
[im 44/99  bone]
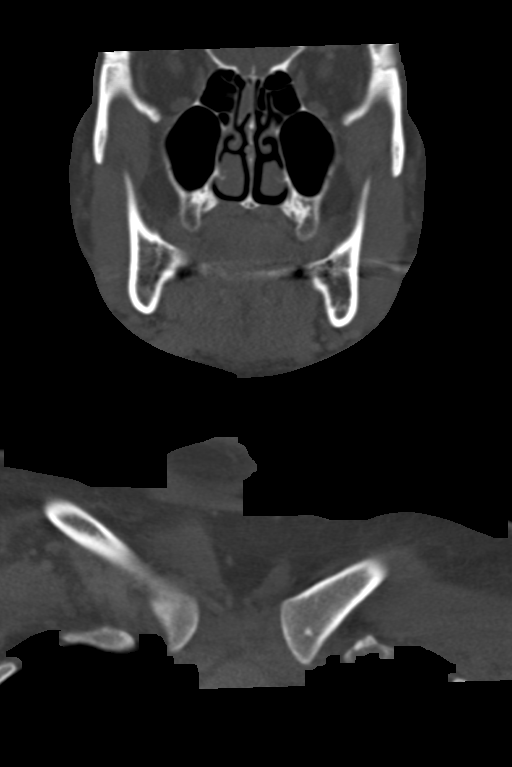
[im 56/99  bone]
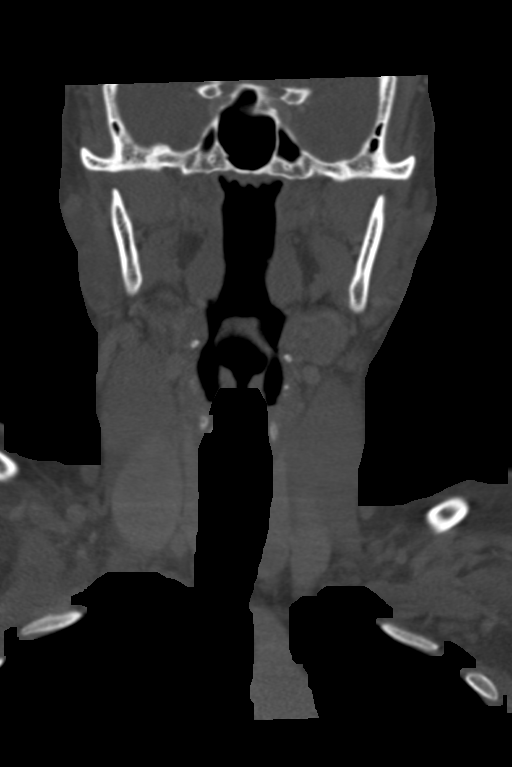

[Series 7: sagittal · sagittal · 0.36mm/px · 5 of 93 slices shown, 6 images]
[im 31/93  bone]
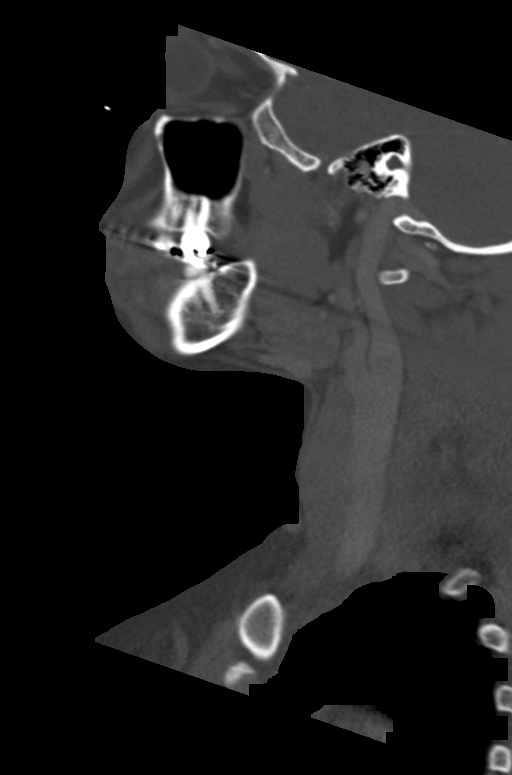
[im 39/93  bone]
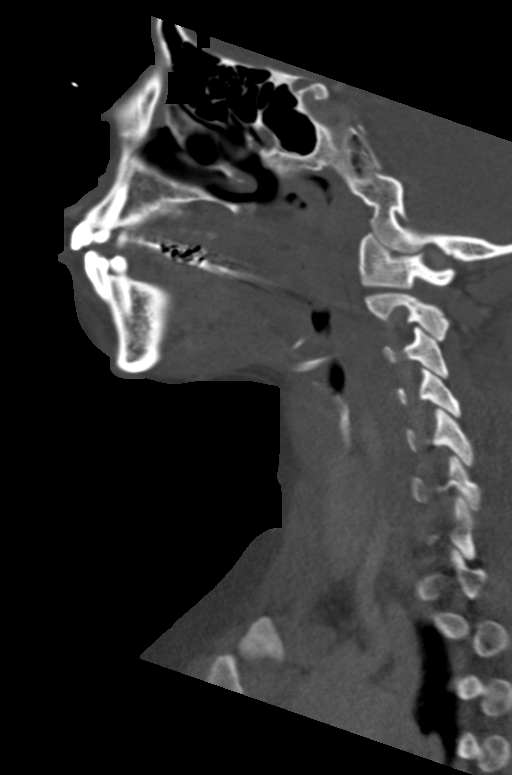
[im 47/93  soft-tissue]
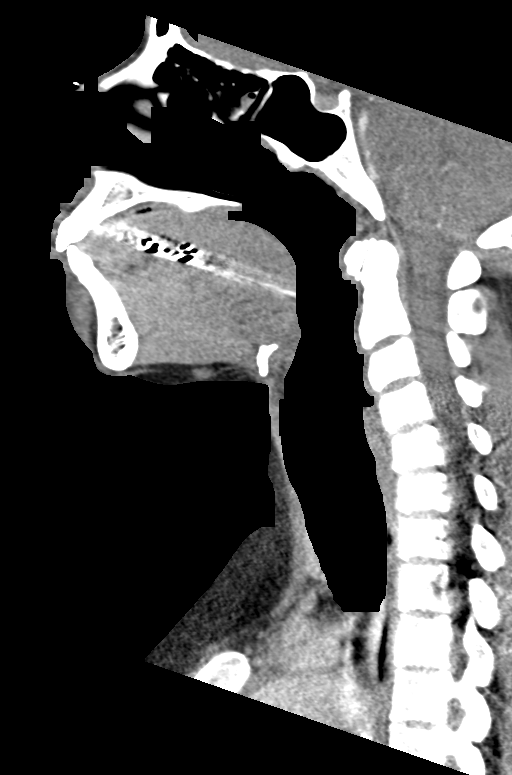
[im 47/93  bone]
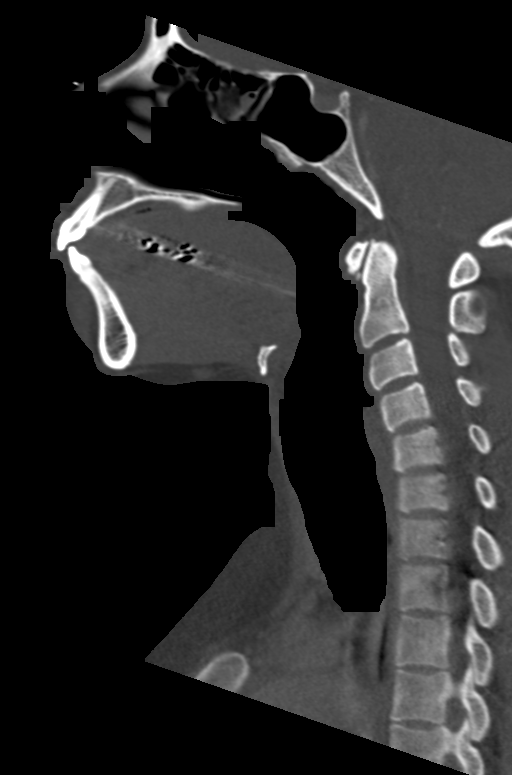
[im 54/93  bone]
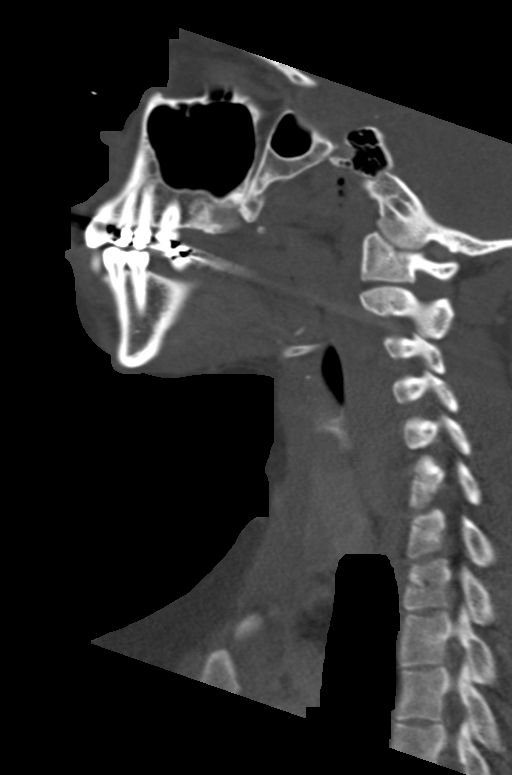
[im 62/93  bone]
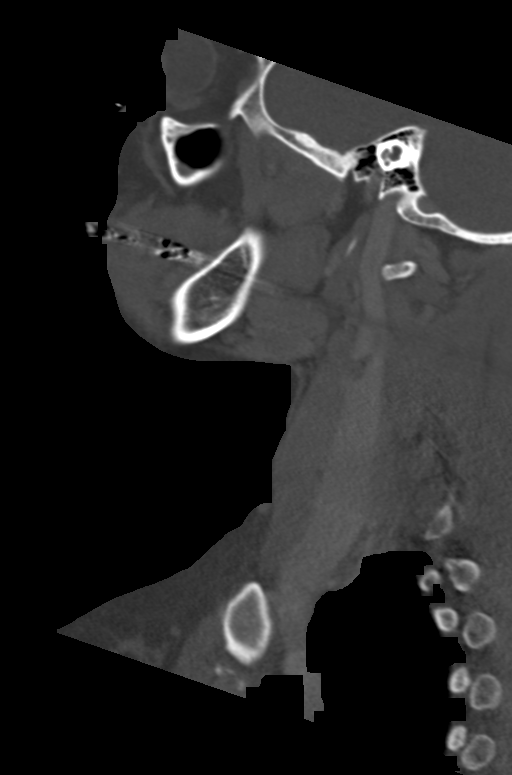

[12 of 33 positions shown; findings below may reference images not displayed]

FINDINGS: Pharynx and larynx: Normal. No mass or swelling.

Salivary glands: No inflammation, mass, or stone.

Thyroid: Negative

Lymph nodes: No enlarged lymph nodes in the neck.

Vascular: Normal vascular enhancement

Limited intracranial: Negative

Visualized orbits: Negative

Mastoids and visualized paranasal sinuses: Mild mucosal edema in the
paranasal sinuses. Mastoid clear bilaterally.

Skeleton: Negative

Upper chest: Lung apices clear bilaterally.

Other: None
IMPRESSION: Negative CT neck.  No evidence of pharyngeal mass or edema.

## 2021-11-20 ENCOUNTER — Ambulatory Visit
Admission: RE | Admit: 2021-11-20 | Discharge: 2021-11-20 | Disposition: A | Discharge status: Home / Self Care | Payer: No Typology Code available for payment source | Source: Ambulatory Visit | Attending: Family Medicine | Admitting: Family Medicine

## 2021-11-20 ENCOUNTER — Other Ambulatory Visit: Payer: Self-pay | Admitting: Family Medicine

## 2021-11-20 DIAGNOSIS — R058 Other specified cough: Secondary | ICD-10-CM

## 2021-12-26 ENCOUNTER — Ambulatory Visit (INDEPENDENT_AMBULATORY_CARE_PROVIDER_SITE_OTHER): Payer: No Typology Code available for payment source | Admitting: Pulmonary Disease

## 2021-12-26 ENCOUNTER — Encounter (HOSPITAL_BASED_OUTPATIENT_CLINIC_OR_DEPARTMENT_OTHER): Payer: Self-pay | Admitting: Pulmonary Disease

## 2021-12-26 VITALS — BP 140/90 | HR 88 | Ht 65.0 in | Wt 164.5 lb

## 2021-12-26 DIAGNOSIS — R059 Cough, unspecified: Secondary | ICD-10-CM | POA: Diagnosis not present

## 2021-12-26 LAB — PULMONARY FUNCTION TEST
FEF 25-75 Post: 2.62 L/sec
FEF 25-75 Pre: 2.19 L/sec
FEF2575-%Change-Post: 19 %
FEF2575-%Pred-Post: 77 %
FEF2575-%Pred-Pre: 64 %
FEV1-%Change-Post: 5 %
FEV1-%Pred-Post: 73 %
FEV1-%Pred-Pre: 69 %
FEV1-Post: 2.35 L
FEV1-Pre: 2.24 L
FEV1FVC-%Change-Post: 2 %
FEV1FVC-%Pred-Pre: 97 %
FEV6-%Change-Post: 2 %
FEV6-%Pred-Post: 74 %
FEV6-%Pred-Pre: 71 %
FEV6-Post: 2.83 L
FEV6-Pre: 2.75 L
FEV6FVC-%Pred-Post: 101 %
FEV6FVC-%Pred-Pre: 101 %
FVC-%Change-Post: 2 %
FVC-%Pred-Post: 73 %
FVC-%Pred-Pre: 70 %
FVC-Post: 2.83 L
FVC-Pre: 2.75 L
Post FEV1/FVC ratio: 83 %
Post FEV6/FVC ratio: 100 %
Pre FEV1/FVC ratio: 81 %
Pre FEV6/FVC Ratio: 100 %

## 2021-12-26 MED ORDER — BUDESONIDE-FORMOTEROL FUMARATE 160-4.5 MCG/ACT IN AERO: 2.0000 | INHALATION_SPRAY | Freq: Two times a day (BID) | RESPIRATORY_TRACT | 3 refills | Status: AC

## 2021-12-26 NOTE — Patient Instructions (Signed)
Chronic cough Cough variant asthma --ARRANGE for pulmonary function tests (PFTs) today --Reviewed PFT results. --START Symbicort 160-4.5 mcg TWO puffs in the morning and evening. Rinse mouth out after use  Hypertension --BP 140/90 --Denies prior elevated pressures --Please discuss with your PCP  Follow-up with me in Feb

## 2021-12-26 NOTE — Progress Notes (Signed)
Subjective:   PATIENT ID: Ariel Pittman GENDER: female DOB: April 30, 1985, MRN: [id number]  Chief Complaint  Patient presents with   Consult    Had mold in her house, and started back smoking. Oct cxr came back fine      Reason for Visit: New consult for cough  Ariel Pittman is a 36 year old female active smoker with no pertinent medical history who presents for new consult for chronic cough.  She was recently seen for well women exam on 11/18/21 with her OB/Gyn who referred her for chronic cough. Reports cough has been ongoing since 5 months that is nonproductive. No preceding illness. Worse at night. Tried inhaler in October with some improvement. Used family's nebulizer with significant improvement. Currently smoking 1/2 ppd in the last year. She reports her home previously was damp due to plumbing issues.   Had allergies to dust, grass. Outgrew most allergies. Denies nasal congestion or sinus problems. Denies known reflux or heartburn, sour/metallic taste.  Does report blurry vision  Social History: Active smoker Works at [employer]  I have personally reviewed patient's past medical/family/social history, allergies, current medications  Past Medical History:  Diagnosis Date   No pertinent past medical history    PONV (postoperative nausea and vomiting)      Family History  Problem Relation Age of Onset   Asthma Mother      Social History   Occupational History   Not on file  Tobacco Use   Smoking status: Every Day    Packs/day: 0.50    Types: Cigarettes    Last attempt to quit: 02/13/2017    Years since quitting: 4.8   Smokeless tobacco: Never   Tobacco comments:    Started smoking at 17 stopped at age 57 restarted at 66  Substance and Sexual Activity   Alcohol use: No    Alcohol/week: 2.0 standard drinks of alcohol    Types: 2 Standard drinks or equivalent per week   Drug use: No   Sexual activity: Yes    Birth control/protection: None     Allergies  Allergen Reactions   Tomato Hives     Outpatient Medications Prior to Visit  Medication Sig Dispense Refill   albuterol (VENTOLIN HFA) 108 (90 Base) MCG/ACT inhaler SMARTSIG:2 Puff(s) By Mouth Every 4-6 Hours     diphenhydrAMINE (BENADRYL) 25 MG tablet Take 1 tablet (25 mg total) by mouth every 6 (six) hours. (Patient not taking: Reported on 12/26/2021) 10 tablet 0   famotidine (PEPCID) 20 MG tablet Take 1 tablet (20 mg total) by mouth 2 (two) times daily. (Patient not taking: Reported on 12/26/2021) 10 tablet 0   No facility-administered medications prior to visit.    Review of Systems  Constitutional:  Negative for chills, diaphoresis, fever, malaise/fatigue and weight loss.  HENT:  Negative for congestion.   Respiratory:  Positive for cough. Negative for hemoptysis, sputum production, shortness of breath and wheezing.   Cardiovascular:  Negative for chest pain, palpitations and leg swelling.     Objective:   Vitals:   12/26/21 1533  BP: (!) 140/90  Pulse: 88  SpO2: 100%  Weight: 164 lb 7.4 oz (74.6 kg)  Height: 5\' 5"  (1.651 m)   SpO2: 100 % O2 Device: None (Room air)  Physical Exam: General: Well-appearing, no acute distress HENT: [place], AT Eyes: EOMI, no scleral icterus Respiratory: Clear to auscultation bilaterally.  No crackles, wheezing or rales Cardiovascular: RRR, -M/R/G, no JVD Extremities:-Edema,-tenderness Neuro: AAO x4,  CNII-XII grossly intact Psych: Normal mood, normal affect  Data Reviewed:  Imaging: CXR 11/20/21 - No acute abnormalities  PFT: None on file  Labs: CBC    Component Value Date/Time   WBC 6.6 12/04/2018 1849   RBC 4.23 12/04/2018 1849   HGB 13.0 12/04/2018 1849   HGB 12.4 11/05/2017 1658   HCT 40.2 12/04/2018 1849   HCT 38.1 11/05/2017 1658   PLT 341 12/04/2018 1849   PLT 340 11/05/2017 1658   MCV 95.0 12/04/2018 1849   MCV 92 11/05/2017 1658   MCH 30.7 12/04/2018 1849   MCHC 32.3 12/04/2018 1849   RDW 11.3  (L) 12/04/2018 1849   RDW 10.6 (L) 11/05/2017 1658   LYMPHSABS 1.9 12/04/2018 1849   LYMPHSABS 2.1 11/05/2017 1658   MONOABS 0.4 12/04/2018 1849   EOSABS 0.1 12/04/2018 1849   EOSABS 0.1 11/05/2017 1658   BASOSABS 0.0 12/04/2018 1849   BASOSABS 0.0 11/05/2017 1658   Abs eos 12/04/18 - 100     Assessment & Plan:   Discussion: 36 year old female active smoker with no pertinent medical history who presents for new consult for chronic cough.  Common causes of cough were discussed including upper airway cough syndrome, reflux and undiagnosed obstructive lung disease. We reviewed evaluation and management for cough as noted below.  Chronic cough Cough variant asthma --ARRANGE for pulmonary function tests (PFTs) today --Reviewed PFT results. --START Symbicort 160-4.5 mcg TWO puffs in the morning and evening. Rinse mouth out after use  Hypertension --BP 140/90 --Denies prior elevated pressures --Please discuss with your PCP   Health Maintenance  There is no immunization history on file for this patient. CT Lung Screen  Orders Placed This Encounter  Procedures   Pulmonary function test    Before and after    Standing Status:   Future    Number of Occurrences:   1    Standing Expiration Date:   12/27/2022    Order Specific Question:   Where should this test be performed?    Answer:   [facility]   Meds ordered this encounter  Medications   budesonide-formoterol (SYMBICORT) 160-4.5 MCG/ACT inhaler    Sig: Inhale 2 puffs into the lungs in the morning and at bedtime.    Dispense:  1 each    Refill:  3    Return in about 2 months (around 02/26/2022).  I have spent a total time of 50-minutes on the day of the appointment reviewing prior documentation, coordinating care and discussing medical diagnosis and plan with the patient/family. Imaging, labs and tests included in this note have been reviewed and interpreted independently by me.  Anjeanette Petzold Mechele Collin, MD [facility] 12/26/2021 4:40 PM  Office Number [phone]

## 2021-12-26 NOTE — Progress Notes (Signed)
Pre/Post Spirometry Performed Today. 

## 2021-12-26 NOTE — Patient Instructions (Signed)
Pre/Post Spirometry Performed Today. 

## 2022-03-06 ENCOUNTER — Ambulatory Visit (HOSPITAL_BASED_OUTPATIENT_CLINIC_OR_DEPARTMENT_OTHER): Payer: No Typology Code available for payment source | Admitting: Pulmonary Disease

## 2023-05-18 ENCOUNTER — Emergency Department (HOSPITAL_COMMUNITY)
Admission: EM | Admit: 2023-05-18 | Discharge: 2023-05-18 | Disposition: A | Discharge status: Home / Self Care | Attending: Emergency Medicine | Admitting: Emergency Medicine

## 2023-05-18 DIAGNOSIS — N760 Acute vaginitis: Secondary | ICD-10-CM | POA: Insufficient documentation

## 2023-05-18 DIAGNOSIS — R102 Pelvic and perineal pain: Secondary | ICD-10-CM | POA: Diagnosis present

## 2023-05-18 LAB — CBC
HCT: 41.9 % (ref 36.0–46.0)
Hemoglobin: 13.8 g/dL (ref 12.0–15.0)
MCH: 31.7 pg (ref 26.0–34.0)
MCHC: 32.9 g/dL (ref 30.0–36.0)
MCV: 96.3 fL (ref 80.0–100.0)
Platelets: 312 10*3/uL (ref 150–400)
RBC: 4.35 MIL/uL (ref 3.87–5.11)
RDW: 11.3 % — ABNORMAL LOW (ref 11.5–15.5)
WBC: 6.7 10*3/uL (ref 4.0–10.5)
nRBC: 0 % (ref 0.0–0.2)

## 2023-05-18 LAB — URINALYSIS, ROUTINE W REFLEX MICROSCOPIC
Bacteria, UA: NONE SEEN
Bilirubin Urine: NEGATIVE
Glucose, UA: NEGATIVE mg/dL
Hgb urine dipstick: NEGATIVE
Ketones, ur: 20 mg/dL — AB
Leukocytes,Ua: NEGATIVE
Nitrite: NEGATIVE
Protein, ur: 30 mg/dL — AB
Specific Gravity, Urine: 1.032 — ABNORMAL HIGH (ref 1.005–1.030)
pH: 5 (ref 5.0–8.0)

## 2023-05-18 LAB — COMPREHENSIVE METABOLIC PANEL WITH GFR
ALT: 17 U/L (ref 0–44)
AST: 24 U/L (ref 15–41)
Albumin: 4.3 g/dL (ref 3.5–5.0)
Alkaline Phosphatase: 70 U/L (ref 38–126)
Anion gap: 8 (ref 5–15)
BUN: 16 mg/dL (ref 6–20)
CO2: 27 mmol/L (ref 22–32)
Calcium: 9.4 mg/dL (ref 8.9–10.3)
Chloride: 103 mmol/L (ref 98–111)
Creatinine, Ser: 0.78 mg/dL (ref 0.44–1.00)
GFR, Estimated: >60 mL/min (ref >60)
Glucose, Bld: 90 mg/dL (ref 70–99)
Potassium: 3.7 mmol/L (ref 3.5–5.1)
Sodium: 138 mmol/L (ref 135–145)
Total Bilirubin: 0.8 mg/dL (ref 0.0–1.2)
Total Protein: 8.7 g/dL — ABNORMAL HIGH (ref 6.5–8.1)

## 2023-05-18 LAB — LIPASE, BLOOD: Lipase: 25 U/L (ref 11–51)

## 2023-05-18 LAB — WET PREP, GENITAL
Sperm: NONE SEEN
Trich, Wet Prep: NONE SEEN
WBC, Wet Prep HPF POC: <10 (ref <10)
Yeast Wet Prep HPF POC: NONE SEEN

## 2023-05-18 LAB — HCG, SERUM, QUALITATIVE: Preg, Serum: NEGATIVE

## 2023-05-18 MED ORDER — METRONIDAZOLE 500 MG PO TABS: 500.0000 mg | ORAL_TABLET | Freq: Two times a day (BID) | ORAL | 0 refills | Status: AC

## 2023-05-18 MED ORDER — METRONIDAZOLE 500 MG PO TABS
500.0000 mg | ORAL_TABLET | Freq: Once | ORAL | Status: AC
  Administered 2023-05-18: 500 mg via ORAL
  Filled 2023-05-18: qty 1

## 2023-05-18 NOTE — ED Provider Notes (Signed)
 [facility] AT [facility] Provider Note   CSN: [id number] Arrival date & time: 05/18/23  4098     History  Chief Complaint  Patient presents with   Abdominal Pain    Ariel Pittman is a 38 y.o. female with no significant past medical history is presenting with concern for vaginal pain and mild abdominal cramping that began yesterday.  Also reports increased vaginal discharge.  Denies any fever, chills, nausea or vomiting, diarrhea.  Reports normal bowel movements.  Reports she recently self treated for possible yeast infection with a cream, then her increased vaginal discharge started.   Abdominal Pain      Home Medications Prior to Admission medications   Medication Sig Start Date End Date Taking? Authorizing Provider  metroNIDAZOLE  (FLAGYL ) 500 MG tablet Take 1 tablet (500 mg total) by mouth 2 (two) times daily. 05/18/23  Yes Rexie Catena, PA-C  albuterol (VENTOLIN HFA) 108 (90 Base) MCG/ACT inhaler SMARTSIG:2 Puff(s) By Mouth Every 4-6 Hours 11/19/21   [provider]  budesonide -formoterol  (SYMBICORT ) 160-4.5 MCG/ACT inhaler Inhale 2 puffs into the lungs in the morning and at bedtime. 12/26/21   Quillian Brunt, MD  diphenhydrAMINE  (BENADRYL ) 25 MG tablet Take 1 tablet (25 mg total) by mouth every 6 (six) hours. Patient not taking: Reported on 12/26/2021 12/04/18   Layden, Lindsey A, PA-C  famotidine  (PEPCID ) 20 MG tablet Take 1 tablet (20 mg total) by mouth 2 (two) times daily. Patient not taking: Reported on 12/26/2021 12/04/18   Layden, Lindsey A, PA-C      Allergies    Tomato    Review of Systems   Review of Systems  Gastrointestinal:  Positive for abdominal pain.    Physical Exam Updated Vital Signs BP (!) 135/98 (BP Location: Right Arm)   Pulse 76   Temp 99.4 F (37.4 C) (Oral)   Resp 17   Ht 5\' 5"  (1.651 m)   Wt 74 kg   LMP 04/03/2023 (Approximate)   SpO2 98%   BMI 27.15 kg/m  Physical Exam Vitals and  nursing note reviewed. Exam conducted with a chaperone present.  Constitutional:      General: She is not in acute distress.    Appearance: She is well-developed.  HENT:     Head: Normocephalic and atraumatic.  Eyes:     Conjunctiva/sclera: Conjunctivae normal.  Cardiovascular:     Rate and Rhythm: Normal rate and regular rhythm.     Heart sounds: No murmur heard. Pulmonary:     Effort: Pulmonary effort is normal. No respiratory distress.     Breath sounds: Normal breath sounds.  Abdominal:     Palpations: Abdomen is soft.     Tenderness: There is no abdominal tenderness.     Comments: Abdomen soft nontender, no rebound or guarding  Genitourinary:    Comments: RN present to chaperone exam  Patient with healthy appearing vaginal canal and cervix.  Cervix nonfriable.  No cervical motion tenderness.  Moderate amount of white discharge in the vaginal vault Musculoskeletal:        General: No swelling.     Cervical back: Neck supple.  Skin:    General: Skin is warm and dry.     Capillary Refill: Capillary refill takes less than 2 seconds.  Neurological:     Mental Status: She is alert.  Psychiatric:        Mood and Affect: Mood normal.     ED Results / Procedures / Treatments  Labs (all labs ordered are listed, but only abnormal results are displayed) Labs Reviewed  WET PREP, GENITAL - Abnormal; Notable for the following components:      Result Value   Clue Cells Wet Prep HPF POC PRESENT (*)    All other components within normal limits  COMPREHENSIVE METABOLIC PANEL WITH GFR - Abnormal; Notable for the following components:   Total Protein 8.7 (*)    All other components within normal limits  CBC - Abnormal; Notable for the following components:   RDW 11.3 (*)    All other components within normal limits  URINALYSIS, ROUTINE W REFLEX MICROSCOPIC - Abnormal; Notable for the following components:   APPearance HAZY (*)    Specific Gravity, Urine 1.032 (*)    Ketones, ur  20 (*)    Protein, ur 30 (*)    All other components within normal limits  LIPASE, BLOOD  HCG, SERUM, QUALITATIVE  GC/CHLAMYDIA PROBE AMP ([place]) NOT AT [facility]    EKG None  Radiology No results found.  Procedures Procedures    Medications Ordered in ED Medications  metroNIDAZOLE  (FLAGYL ) tablet 500 mg (500 mg Oral Given 05/18/23 1334)    ED Course/ Medical Decision Making/ A&P                                 Medical Decision Making Amount and/or Complexity of Data Reviewed Labs: ordered.  Risk Prescription drug management.     Differential diagnosis includes but is not limited to Cholelithiasis, cholangitis, choledocholithiasis, peptic ulcer, gastritis, gastroenteritis, appendicitis, IBS, IBD, DKA, nephrolithiasis, UTI, pyelonephritis, pancreatitis, diverticulitis, mesenteric ischemia, abdominal aortic aneurysm, small bowel obstruction, volvulus, ovarian torsion and pregnancy related concerns in females of childbearing age    ED Course:  Upon initial evaluation, patient is well-appearing, stable vitals aside from elevated blood pressure 135/98.  Reporting lower abdominal pain and increased vaginal discharge.  Abdomen is soft and nontender, no rebound or guarding.  Pelvic exam performed with RN chaperone present, moderate amount of white discharge in the vaginal vault.  No cervical motion tenderness.  Labs Ordered: I Ordered, and personally interpreted labs.  The pertinent results include:   CBC within normal limits, no leukocytosis CMP without any elevation creatinine or LFTs.  Normal electrolytes Lipase within normal limits Urinalysis with high specific gravity, no nitrates or leukocytes Wet prep with clue cells present, no trichomoniasis or yeast GC swab pending   Medications Given: Metronidazole  for bacterial vaginosis  Upon re-evaluation, patient still well-appearing.  Given abdomen is soft and nontender, CBC, CMP, and lipase unremarkable, low concern  for acute intra-abdominal pathology.  No indication for CT imaging at this time.  Urinalysis without signs of infection. Reporting normal bowel movements, no concern for constipation or SBO at this time.  Wet prep with clue cells was present, will treat for bacterial vaginosis.  Suspect this is the cause of her increased vaginal discharge and pain.  She denies known STI exposure, will not prophylactically treat for STIs at this time.  No cervical motion tenderness.  GC swab has been sent and patient understands she will be contacted if she needs further treatment.    Impression: Bacterial vaginosis  Disposition:  The patient was discharged home with instructions to take 5-day course of metronidazole  as prescribed. Avoid alcohol while taking this medication. Follow-up with PCP if symptoms not improved by the end of the antibiotic course. Return precautions given.  This chart was dictated using voice recognition software, Dragon. Despite the best efforts of this provider to proofread and correct errors, errors may still occur which can change documentation meaning.          Final Clinical Impression(s) / ED Diagnoses Final diagnoses:  Bacterial vaginosis    Rx / DC Orders ED Discharge Orders          Ordered    metroNIDAZOLE  (FLAGYL ) 500 MG tablet  2 times daily        05/18/23 1344              Rexie Catena, PA-C 05/18/23 1351    Russella Courts A, DO 05/21/23 [phone]

## 2023-05-18 NOTE — Discharge Instructions (Addendum)
 You tested positive for bacterial vaginosis.  You are being treated with a antibiotic called metronidazole .  Please take this twice daily for the next 5 days.  Do not drink while on this medication as it can cause nausea and vomiting. You were given your first dose here today.  Your kidney, liver, and pancreas labs are normal today.  Your blood counts were normal.  Your electrolytes were normal.  Your urine did not show any signs of infection.  You do appear dehydrated.  Please increase your intake of water.  Please follow-up with your PCP if your symptoms do not start to improve within the next 5 days.  Return to the ER for any severe abdominal pain, uncontrolled vomiting, fevers, any other new or concerning symptoms.

## 2023-05-18 NOTE — ED Triage Notes (Signed)
 Pt arrived reporting cramping from vaginal to buttocks yesterday, states episode happened after she had a drink. Also reports intermittent nausea and pain with urination. Denies any other urinary symptoms. Reports recently treated for yeast infection

## 2023-05-19 LAB — GC/CHLAMYDIA PROBE AMP (~~LOC~~) NOT AT ARMC
Chlamydia: NEGATIVE
Comment: NEGATIVE
Comment: NORMAL
Neisseria Gonorrhea: NEGATIVE
